# Patient Record
Sex: Female | Born: 1955 | Race: White | Hispanic: No | Marital: Married | Smoking: Current every day smoker
Health system: Southern US, Community
[De-identification: ages and names within clinical notes are randomized; demographics above are authoritative.]

## PROBLEM LIST (undated history)

## (undated) DIAGNOSIS — N189 Chronic kidney disease, unspecified: Secondary | ICD-10-CM

## (undated) DIAGNOSIS — B019 Varicella without complication: Secondary | ICD-10-CM

## (undated) DIAGNOSIS — IMO0002 Reserved for concepts with insufficient information to code with codable children: Secondary | ICD-10-CM

## (undated) DIAGNOSIS — R413 Other amnesia: Secondary | ICD-10-CM

## (undated) HISTORY — DX: Chronic kidney disease, unspecified: N18.9

## (undated) HISTORY — DX: Varicella without complication: B01.9

## (undated) HISTORY — PX: TONSILLECTOMY: SUR1361

## (undated) HISTORY — DX: Other amnesia: R41.3

## (undated) HISTORY — DX: Reserved for concepts with insufficient information to code with codable children: IMO0002

## (undated) HISTORY — PX: BREAST SURGERY: SHX581

## (undated) HISTORY — PX: FOOT SURGERY: SHX648

## (undated) HISTORY — PX: BREAST ENHANCEMENT SURGERY: SHX7

---

## 1998-02-02 ENCOUNTER — Emergency Department (HOSPITAL_COMMUNITY): Admission: RE | Admit: 1998-02-02 | Discharge: 1998-02-02 | Payer: Self-pay | Admitting: Emergency Medicine

## 1998-08-19 ENCOUNTER — Emergency Department (HOSPITAL_COMMUNITY): Admission: EM | Admit: 1998-08-19 | Discharge: 1998-08-19 | Payer: Self-pay | Admitting: Emergency Medicine

## 1998-08-19 ENCOUNTER — Encounter: Payer: Self-pay | Admitting: Emergency Medicine

## 1998-09-23 ENCOUNTER — Other Ambulatory Visit: Admission: RE | Admit: 1998-09-23 | Discharge: 1998-09-23 | Payer: Self-pay | Admitting: Obstetrics and Gynecology

## 2000-05-28 ENCOUNTER — Emergency Department (HOSPITAL_COMMUNITY): Admission: EM | Admit: 2000-05-28 | Discharge: 2000-05-29 | Payer: Self-pay | Admitting: Emergency Medicine

## 2000-06-28 ENCOUNTER — Ambulatory Visit (HOSPITAL_COMMUNITY): Admission: RE | Admit: 2000-06-28 | Discharge: 2000-06-28 | Payer: Self-pay | Admitting: Gastroenterology

## 2000-12-01 ENCOUNTER — Other Ambulatory Visit: Admission: RE | Admit: 2000-12-01 | Discharge: 2000-12-01 | Payer: Self-pay | Admitting: Obstetrics and Gynecology

## 2001-05-05 ENCOUNTER — Emergency Department (HOSPITAL_COMMUNITY): Admission: EM | Admit: 2001-05-05 | Discharge: 2001-05-06 | Payer: Self-pay | Admitting: Emergency Medicine

## 2001-05-07 ENCOUNTER — Emergency Department (HOSPITAL_COMMUNITY): Admission: EM | Admit: 2001-05-07 | Discharge: 2001-05-08 | Payer: Self-pay | Admitting: Emergency Medicine

## 2001-05-07 ENCOUNTER — Encounter: Payer: Self-pay | Admitting: Emergency Medicine

## 2001-05-22 ENCOUNTER — Encounter: Admission: RE | Admit: 2001-05-22 | Discharge: 2001-05-22 | Payer: Self-pay | Admitting: Gastroenterology

## 2001-05-22 ENCOUNTER — Encounter: Payer: Self-pay | Admitting: Gastroenterology

## 2007-11-20 LAB — HM COLONOSCOPY: HM Colonoscopy: NEGATIVE

## 2010-04-19 LAB — HM PAP SMEAR: HM Pap smear: NEGATIVE

## 2010-05-19 LAB — HM MAMMOGRAPHY: HM Mammogram: NEGATIVE

## 2011-07-21 ENCOUNTER — Ambulatory Visit (INDEPENDENT_AMBULATORY_CARE_PROVIDER_SITE_OTHER): Payer: BC Managed Care – PPO | Admitting: Family Medicine

## 2011-07-21 ENCOUNTER — Encounter: Payer: Self-pay | Admitting: Family Medicine

## 2011-07-21 DIAGNOSIS — Z780 Asymptomatic menopausal state: Secondary | ICD-10-CM

## 2011-07-21 DIAGNOSIS — Z720 Tobacco use: Secondary | ICD-10-CM | POA: Insufficient documentation

## 2011-07-21 DIAGNOSIS — F17200 Nicotine dependence, unspecified, uncomplicated: Secondary | ICD-10-CM

## 2011-07-21 MED ORDER — PROGESTERONE MICRONIZED 100 MG PO CAPS: 100.0000 mg | ORAL_CAPSULE | Freq: Every day | ORAL | Status: DC

## 2011-07-21 MED ORDER — ESTRADIOL 0.075 MG/24HR TD PTTW: 1.0000 | MEDICATED_PATCH | TRANSDERMAL | Status: DC

## 2011-07-21 NOTE — Progress Notes (Signed)
  Subjective:    Patient ID: Michaela Thompson, female    DOB: 08/19/1956, 55 y.o.   MRN: [id number]  HPI Patient is seen to establish care. Past medical history viewed. Question of remote peptic ulcer as a child. No significant chronic medical problems. She is postmenopausal and has been maintained on estrogen patches and progesterone per previous gynecologist. She had physical last summer. Old records pending. No other medications. Surgical history foot repair 1998. No details of that surgery.  Family history significant for mild osteoarthritis. Father died of coronary disease age 31. Paternal grandmother with CVA history.  Patient is married. 2 children and 6 grandchildren. Patient has worked previously with [employer]. Smokes one half pack per day 35 years. No alcohol use.  She desires to quit smoking. Plan is to try to quit on her own. Not interested in medications. Estrogen use as above. She's gotten yearly mammograms. No history of coagulopathy.   Review of Systems  Constitutional: Negative for fever, activity change, appetite change, fatigue and unexpected weight change.  HENT: Negative for hearing loss, ear pain, sore throat and trouble swallowing.   Eyes: Negative for visual disturbance.  Respiratory: Negative for cough and shortness of breath.   Cardiovascular: Negative for chest pain and palpitations.  Gastrointestinal: Negative for abdominal pain, diarrhea, constipation and blood in stool.  Genitourinary: Negative for dysuria and hematuria.  Musculoskeletal: Negative for myalgias, back pain and arthralgias.  Skin: Negative for rash.  Neurological: Negative for dizziness, syncope and headaches.  Hematological: Negative for adenopathy.  Psychiatric/Behavioral: Negative for confusion and dysphoric mood.       Objective:   Physical Exam  Constitutional: She is oriented to person, place, and time. She appears well-developed and well-nourished.  HENT:  Right Ear:  External ear normal.  Left Ear: External ear normal.  Mouth/Throat: Oropharynx is clear and moist.  Neck: Neck supple. No thyromegaly present.  Cardiovascular: Normal rate, regular rhythm and normal heart sounds.   No murmur heard. Pulmonary/Chest: Effort normal and breath sounds normal. No respiratory distress. She has no wheezes. She has no rales.  Musculoskeletal: She exhibits no edema.  Lymphadenopathy:    She has no cervical adenopathy.  Neurological: She is alert and oriented to person, place, and time. No cranial nerve deficit.  Skin: No rash noted.  Psychiatric: She has a normal mood and affect. Her behavior is normal. Judgment and thought content normal.          Assessment & Plan:  #1 postmenopausal. Long discussion with patient regarding pros and cons of estrogen replacement therapy. We have suggested that she consider tapering her in the next year. She is encouraged to schedule complete physical next bring and discuss further at that time. Refills given for now. #2 nicotine abuse. Spent approximately 10 minutes discussing smoking cessation strategies. She plans to try to quit on her own

## 2011-07-21 NOTE — Patient Instructions (Signed)
Consider complete physical next year

## 2011-09-10 ENCOUNTER — Other Ambulatory Visit (INDEPENDENT_AMBULATORY_CARE_PROVIDER_SITE_OTHER): Payer: BC Managed Care – PPO

## 2011-09-10 DIAGNOSIS — Z0000 Encounter for general adult medical examination without abnormal findings: Secondary | ICD-10-CM

## 2011-09-10 LAB — CBC WITH DIFFERENTIAL/PLATELET
Basophils Relative: 0.7 % (ref 0.0–3.0)
Eosinophils Absolute: 0.1 10*3/uL (ref 0.0–0.7)
Hemoglobin: 14 g/dL (ref 12.0–15.0)
MCHC: 33.9 g/dL (ref 30.0–36.0)
MCV: 93.5 fl (ref 78.0–100.0)
Monocytes Absolute: 0.5 10*3/uL (ref 0.1–1.0)
Neutro Abs: 2.3 10*3/uL (ref 1.4–7.7)
RBC: 4.42 Mil/uL (ref 3.87–5.11)

## 2011-09-10 LAB — HEPATIC FUNCTION PANEL
ALT: 13 U/L (ref 0–35)
Albumin: 3.7 g/dL (ref 3.5–5.2)
Alkaline Phosphatase: 38 U/L — ABNORMAL LOW (ref 39–117)
Total Protein: 6.4 g/dL (ref 6.0–8.3)

## 2011-09-10 LAB — BASIC METABOLIC PANEL
CO2: 29 mEq/L (ref 19–32)
Chloride: 106 mEq/L (ref 96–112)
Potassium: 3.9 mEq/L (ref 3.5–5.1)
Sodium: 141 mEq/L (ref 135–145)

## 2011-09-10 LAB — POCT URINALYSIS DIPSTICK
Bilirubin, UA: NEGATIVE
Glucose, UA: NEGATIVE
Ketones, UA: NEGATIVE
Leukocytes, UA: NEGATIVE
pH, UA: 8.5

## 2011-09-10 LAB — LDL CHOLESTEROL, DIRECT: Direct LDL: 166 mg/dL

## 2011-09-10 LAB — LIPID PANEL
Cholesterol: 246 mg/dL — ABNORMAL HIGH (ref 0–200)
Triglycerides: 55 mg/dL (ref 0.0–149.0)

## 2011-09-20 ENCOUNTER — Encounter: Payer: Self-pay | Admitting: Family Medicine

## 2011-09-20 ENCOUNTER — Ambulatory Visit (INDEPENDENT_AMBULATORY_CARE_PROVIDER_SITE_OTHER): Payer: BC Managed Care – PPO | Admitting: Family Medicine

## 2011-09-20 VITALS — BP 92/68 | Temp 98.0°F | Ht 62.0 in | Wt 123.5 lb

## 2011-09-20 DIAGNOSIS — Z0000 Encounter for general adult medical examination without abnormal findings: Secondary | ICD-10-CM

## 2011-09-20 NOTE — Patient Instructions (Signed)
Increase calcium consumption with a goal of about 1200 mg per day Try to quit smoking and let me know if I can assist you in any way Schedule repeat mammogram Continue regular weightbearing exercise. Consider repeat Pap smear by next year

## 2011-09-20 NOTE — Progress Notes (Signed)
  Subjective:    Patient ID: Michaela Thompson, female    DOB: 1955/12/18, 55 y.o.   MRN: [id number]  HPI  Patient seen for complete physical. Refer to prior note. She is postmenopausal. Ongoing nicotine use but has strong motivation to try to quit. She exercises fairly regularly. Has some osteoarthritis issues involving the knees. Takes vitamin D but no calcium. Last mammogram about a year ago. She has had normal Pap smears for several years and plans to go to every third year. No history of any reported abnormals.  Past Medical History  Diagnosis Date  . Chicken pox   . Ulcer     stomach as a child  . Chronic kidney disease    Past Surgical History  Procedure Date  . Foot surgery     reports that she has been smoking Cigarettes.  She has a 17.5 pack-year smoking history. She does not have any smokeless tobacco history on file. Her alcohol and drug histories not on file. family history includes Alcohol abuse in her maternal grandfather; Arthritis in her mother; Cancer in her maternal grandmother; Heart disease in her father; and Stroke in her maternal grandmother. Allergies  Allergen Reactions  . Sulfa Antibiotics     hives      Review of Systems  Constitutional: Positive for appetite change. Negative for fever, activity change and fatigue.  HENT: Negative for hearing loss, ear pain, sore throat and trouble swallowing.   Eyes: Negative for visual disturbance.  Respiratory: Negative for cough and shortness of breath.   Cardiovascular: Negative for chest pain and palpitations.  Gastrointestinal: Negative for abdominal pain, diarrhea, constipation and blood in stool.  Genitourinary: Negative for dysuria and hematuria.  Musculoskeletal: Positive for arthralgias. Negative for myalgias and back pain.  Skin: Negative for rash.  Neurological: Negative for dizziness, syncope and headaches.  Hematological: Negative for adenopathy.  Psychiatric/Behavioral: Negative for confusion and  dysphoric mood.       Objective:   Physical Exam  Constitutional: She is oriented to person, place, and time. She appears well-developed and well-nourished.  HENT:  Head: Normocephalic and atraumatic.  Eyes: EOM are normal. Pupils are equal, round, and reactive to light.  Neck: Normal range of motion. Neck supple. No thyromegaly present.  Cardiovascular: Normal rate, regular rhythm and normal heart sounds.   No murmur heard. Pulmonary/Chest: Breath sounds normal. No respiratory distress. She has no wheezes. She has no rales.  Abdominal: Soft. Bowel sounds are normal. She exhibits no distension and no mass. There is no tenderness. There is no rebound and no guarding.  Musculoskeletal: Normal range of motion. She exhibits no edema.  Lymphadenopathy:    She has no cervical adenopathy.  Neurological: She is alert and oriented to person, place, and time. She displays normal reflexes. No cranial nerve deficit.  Skin: No rash noted.  Psychiatric: She has a normal mood and affect. Her behavior is normal. Judgment and thought content normal.          Assessment & Plan:  Complete physical. Discussed smoking cessation. Schedule mammogram. Go to every other year or third year Pap smear. Increase calcium consumption. Colonoscopy up to date. Patient declines flu vaccine.

## 2012-01-25 ENCOUNTER — Ambulatory Visit (INDEPENDENT_AMBULATORY_CARE_PROVIDER_SITE_OTHER): Payer: BC Managed Care – PPO | Admitting: Family

## 2012-01-25 ENCOUNTER — Encounter: Payer: Self-pay | Admitting: Family

## 2012-01-25 VITALS — BP 116/72 | Temp 98.3°F | Wt 122.0 lb

## 2012-01-25 DIAGNOSIS — R51 Headache: Secondary | ICD-10-CM

## 2012-01-25 DIAGNOSIS — K5900 Constipation, unspecified: Secondary | ICD-10-CM

## 2012-01-25 LAB — POCT URINALYSIS DIPSTICK
Glucose, UA: NEGATIVE
Leukocytes, UA: NEGATIVE
Nitrite, UA: NEGATIVE
Protein, UA: NEGATIVE
Spec Grav, UA: 1.025
Urobilinogen, UA: 0.2

## 2012-01-25 NOTE — Patient Instructions (Signed)
Dehydration, Adult Dehydration is when you lose more fluids from the body than you take in. Vital organs like the kidneys, brain, and heart cannot function without a proper amount of fluids and salt. Any loss of fluids from the body can cause dehydration.  CAUSES   Vomiting.   Diarrhea.   Excessive sweating.   Excessive urine output.   Fever.  SYMPTOMS  Mild dehydration  Thirst.   Dry lips.   Slightly dry mouth.  Moderate dehydration  Very dry mouth.   Sunken eyes.   Skin does not bounce back quickly when lightly pinched and released.   Dark urine and decreased urine production.   Decreased tear production.   Headache.  Severe dehydration  Very dry mouth.   Extreme thirst.   Rapid, weak pulse (more than 100 beats per minute at rest).   Cold hands and feet.   Not able to sweat in spite of heat and temperature.   Rapid breathing.   Blue lips.   Confusion and lethargy.   Difficulty being awakened.   Minimal urine production.   No tears.  DIAGNOSIS  Your caregiver will diagnose dehydration based on your symptoms and your exam. Blood and urine tests will help confirm the diagnosis. The diagnostic evaluation should also identify the cause of dehydration. TREATMENT  Treatment of mild or moderate dehydration can often be done at home by increasing the amount of fluids that you drink. It is best to drink small amounts of fluid more often. Drinking too much at one time can make vomiting worse. Refer to the home care instructions below. Severe dehydration needs to be treated at the hospital where you will probably be given intravenous (IV) fluids that contain water and electrolytes. HOME CARE INSTRUCTIONS   Ask your caregiver about specific rehydration instructions.   Drink enough fluids to keep your urine clear or pale yellow.   Drink small amounts frequently if you have nausea and vomiting.   Eat as you normally do.   Avoid:   Foods or drinks high in  sugar.   Carbonated drinks.   Juice.   Extremely hot or cold fluids.   Drinks with caffeine.   Fatty, greasy foods.   Alcohol.   Tobacco.   Overeating.   Gelatin desserts.   Wash your hands well to avoid spreading bacteria and viruses.   Only take over-the-counter or prescription medicines for pain, discomfort, or fever as directed by your caregiver.   Ask your caregiver if you should continue all prescribed and over-the-counter medicines.   Keep all follow-up appointments with your caregiver.  SEEK MEDICAL CARE IF:  You have abdominal pain and it increases or stays in one area (localizes).   You have a rash, stiff neck, or severe headache.   You are irritable, sleepy, or difficult to awaken.   You are weak, dizzy, or extremely thirsty.  SEEK IMMEDIATE MEDICAL CARE IF:   You are unable to keep fluids down or you get worse despite treatment.   You have frequent episodes of vomiting or diarrhea.   You have blood or green matter (bile) in your vomit.   You have blood in your stool or your stool looks black and tarry.   You have not urinated in 6 to 8 hours, or you have only urinated a small amount of very dark urine.   You have a fever.   You faint.  MAKE SURE YOU:   Understand these instructions.   Will watch your condition.     Will get help right away if you are not doing well or get worse.  Document Released: 10/11/2005 Document Revised: 09/30/2011 Document Reviewed: 05/31/2011 [employer] Patient Information 2012 [employer]. 

## 2012-01-25 NOTE — Progress Notes (Signed)
Subjective:    Patient ID: Michaela Thompson, female    DOB: 01-19-1956, 56 y.o.   MRN: [id number]  HPI 56 year old white female, smoker, patient of Dr. Caryl Never is in today with complaints of constipation, dry mouth, dry skin and headache that's been going on for 2 days. She has not taken anything for her symptoms. She has been exercising on a regular basis. Denies an increase in her fluid intake. Denies any sneezing, coughing, congestion, itchy or watery. No new medications.    Review of Systems  Constitutional: Negative.   Eyes: Negative.   Respiratory: Negative.   Cardiovascular: Negative.   Gastrointestinal: Positive for constipation.  Genitourinary: Negative.   Musculoskeletal: Negative.   Neurological: Positive for headaches.  Hematological: Negative.   Psychiatric/Behavioral: Negative.    Past Medical History  Diagnosis Date  . Chicken pox   . Ulcer     stomach as a child  . Chronic kidney disease     History   Social History  . Marital Status: Married    Spouse Name: N/A    Number of Children: N/A  . Years of Education: N/A   Occupational History  . Not on file.   Social History Main Topics  . Smoking status: Current Everyday Smoker -- 0.5 packs/day for 35 years    Types: Cigarettes  . Smokeless tobacco: Not on file  . Alcohol Use: Not on file  . Drug Use: Not on file  . Sexually Active: Not on file   Other Topics Concern  . Not on file   Social History Narrative  . No narrative on file    Past Surgical History  Procedure Date  . Foot surgery     Family History  Problem Relation Age of Onset  . Arthritis Mother   . Heart disease Father     sudden death > 50  . Cancer Maternal Grandmother     breast  . Stroke Maternal Grandmother   . Alcohol abuse Maternal Grandfather     Allergies  Allergen Reactions  . Sulfa Antibiotics     hives    Current Outpatient Prescriptions on File Prior to Visit  Medication Sig Dispense Refill  .  estradiol (VIVELLE-DOT) 0.075 MG/24HR Place 1 patch onto the skin 2 (two) times a week.  8 patch  6  . progesterone (PROMETRIUM) 100 MG capsule Take 1 capsule (100 mg total) by mouth daily.  30 capsule  6    BP 116/72  Temp(Src) 98.3 F (36.8 C) (Oral)  Wt 122 lb (55.339 kg)chart    Objective:   Physical Exam  Constitutional: She is oriented to person, place, and time. She appears well-developed and well-nourished.  HENT:  Right Ear: External ear normal.  Left Ear: External ear normal.  Nose: Nose normal.  Mouth/Throat: Oropharynx is clear and moist.       Lips are dry and cracked  Cardiovascular: Normal rate and normal heart sounds.   Pulmonary/Chest: Effort normal and breath sounds normal.  Abdominal: Soft.  Musculoskeletal: Normal range of motion.  Neurological: She is alert and oriented to person, place, and time.  Skin: Skin is warm and dry.  Psychiatric: She has a normal mood and affect.          Assessment & Plan:  Assessment: Constipation, headache, dry skin, likely dehydration  Plan: Increase fluid intake. Labs to include BMP and CBC will notify patient of results. Call the office if symptoms worsen or persist, return as scheduled or  when necessary.

## 2012-01-26 LAB — CBC WITH DIFFERENTIAL/PLATELET
Basophils Absolute: 0 10*3/uL (ref 0.0–0.1)
Eosinophils Relative: 1.6 % (ref 0.0–5.0)
HCT: 42.2 % (ref 36.0–46.0)
Hemoglobin: 14.3 g/dL (ref 12.0–15.0)
Lymphocytes Relative: 24.6 % (ref 12.0–46.0)
Lymphs Abs: 0.9 10*3/uL (ref 0.7–4.0)
Monocytes Relative: 11.2 % (ref 3.0–12.0)
Neutro Abs: 2.3 10*3/uL (ref 1.4–7.7)
RBC: 4.59 Mil/uL (ref 3.87–5.11)
WBC: 3.7 10*3/uL — ABNORMAL LOW (ref 4.5–10.5)

## 2012-01-26 LAB — BASIC METABOLIC PANEL
Calcium: 9.1 mg/dL (ref 8.4–10.5)
GFR: 77.69 mL/min (ref >60.00)
Potassium: 4.7 mEq/L (ref 3.5–5.1)
Sodium: 140 mEq/L (ref 135–145)

## 2012-02-29 ENCOUNTER — Encounter: Payer: Self-pay | Admitting: Family Medicine

## 2012-02-29 ENCOUNTER — Ambulatory Visit (INDEPENDENT_AMBULATORY_CARE_PROVIDER_SITE_OTHER): Payer: BC Managed Care – PPO | Admitting: Family Medicine

## 2012-02-29 VITALS — BP 116/70 | HR 91 | Temp 97.9°F | Wt 118.0 lb

## 2012-02-29 DIAGNOSIS — R1013 Epigastric pain: Secondary | ICD-10-CM

## 2012-02-29 LAB — POCT URINALYSIS DIPSTICK
Blood, UA: NEGATIVE
Nitrite, UA: NEGATIVE
Urobilinogen, UA: 0.2
pH, UA: 7

## 2012-02-29 MED ORDER — HYDROCODONE-ACETAMINOPHEN 5-500 MG PO TABS: 1.0000 | ORAL_TABLET | ORAL | Status: AC | PRN

## 2012-02-29 MED ORDER — PROMETHAZINE HCL 25 MG PO TABS: 25.0000 mg | ORAL_TABLET | ORAL | Status: DC | PRN

## 2012-02-29 NOTE — Progress Notes (Signed)
  Subjective:    Patient ID: Michaela Thompson, female    DOB: September 24, 1956, 56 y.o.   MRN: [id number]  HPI Here for the sudden onset of epigastric pain that started suddenly last night and has continued all day today. This is worst in the central upper abdomen but involves the entire upper abdomen. It is constant but does wax and wane. No pain in the back. She is nauseated but has not vomited. She has little appetite but has been sipping fluids. No fever at all. No urinary symptoms. Her last BM was this morning and was normal. She tried some OTC antacids with no results.    Review of Systems  Constitutional: Negative.   Respiratory: Negative.   Cardiovascular: Negative.   Gastrointestinal: Positive for nausea and abdominal pain. Negative for vomiting, diarrhea, constipation, blood in stool, abdominal distention and rectal pain.  Genitourinary: Negative.        Objective:   Physical Exam  Constitutional:       Appears very uncomfortable,alert  Cardiovascular: Normal rate, regular rhythm, normal heart sounds and intact distal pulses.   Pulmonary/Chest: Effort normal and breath sounds normal.  Abdominal: Soft. Bowel sounds are normal. She exhibits no distension and no mass. There is no rebound and no guarding.       Moderately tender in the epigastrium and RUQ           Assessment & Plan:  This is probably gall bladder disease. We will get labs today and will set up an abdominal US ASAP. She will continue to drink fluids. She knows to call 911 tonight if the pain would get worse or if she would get a fever.

## 2012-03-01 ENCOUNTER — Ambulatory Visit
Admission: RE | Admit: 2012-03-01 | Discharge: 2012-03-01 | Disposition: A | Discharge status: Home / Self Care | Payer: BC Managed Care – PPO | Source: Ambulatory Visit | Attending: Family Medicine | Admitting: Family Medicine

## 2012-03-01 DIAGNOSIS — R1013 Epigastric pain: Secondary | ICD-10-CM

## 2012-03-01 LAB — CBC WITH DIFFERENTIAL/PLATELET
Basophils Relative: 0.4 % (ref 0.0–3.0)
Eosinophils Relative: 0.4 % (ref 0.0–5.0)
HCT: 44.7 % (ref 36.0–46.0)
Lymphs Abs: 1.1 10*3/uL (ref 0.7–4.0)
MCV: 89.2 fl (ref 78.0–100.0)
Monocytes Absolute: 0.5 10*3/uL (ref 0.1–1.0)
Neutro Abs: 5 10*3/uL (ref 1.4–7.7)
Platelets: 209 10*3/uL (ref 150.0–400.0)
RBC: 5.02 Mil/uL (ref 3.87–5.11)
WBC: 6.7 10*3/uL (ref 4.5–10.5)

## 2012-03-01 LAB — BASIC METABOLIC PANEL
CO2: 27 mEq/L (ref 19–32)
Calcium: 10.1 mg/dL (ref 8.4–10.5)
Chloride: 102 mEq/L (ref 96–112)
Creatinine, Ser: 0.7 mg/dL (ref 0.4–1.2)
Glucose, Bld: 97 mg/dL (ref 70–99)

## 2012-03-01 LAB — LIPASE: Lipase: 26 U/L (ref 11.0–59.0)

## 2012-03-01 LAB — HEPATIC FUNCTION PANEL
ALT: 20 U/L (ref 0–35)
Albumin: 4.4 g/dL (ref 3.5–5.2)
Total Protein: 7.3 g/dL (ref 6.0–8.3)

## 2012-03-01 LAB — AMYLASE: Amylase: 65 U/L (ref 27–131)

## 2012-03-02 NOTE — Progress Notes (Signed)
Quick Note:  I left voice message with normal results. ______ 

## 2012-03-02 NOTE — Progress Notes (Signed)
Quick Note:  I left voice message with normal results and advised pt to follow up with PCP. ______

## 2012-03-04 ENCOUNTER — Emergency Department (HOSPITAL_COMMUNITY): Payer: BC Managed Care – PPO

## 2012-03-04 ENCOUNTER — Encounter (HOSPITAL_COMMUNITY): Payer: Self-pay

## 2012-03-04 ENCOUNTER — Emergency Department (HOSPITAL_COMMUNITY)
Admission: EM | Admit: 2012-03-04 | Discharge: 2012-03-04 | Disposition: A | Discharge status: Home / Self Care | Payer: BC Managed Care – PPO | Attending: Emergency Medicine | Admitting: Emergency Medicine

## 2012-03-04 DIAGNOSIS — R0789 Other chest pain: Secondary | ICD-10-CM

## 2012-03-04 DIAGNOSIS — N189 Chronic kidney disease, unspecified: Secondary | ICD-10-CM | POA: Insufficient documentation

## 2012-03-04 DIAGNOSIS — F17200 Nicotine dependence, unspecified, uncomplicated: Secondary | ICD-10-CM | POA: Insufficient documentation

## 2012-03-04 DIAGNOSIS — R109 Unspecified abdominal pain: Secondary | ICD-10-CM | POA: Insufficient documentation

## 2012-03-04 DIAGNOSIS — Z79899 Other long term (current) drug therapy: Secondary | ICD-10-CM | POA: Insufficient documentation

## 2012-03-04 DIAGNOSIS — R079 Chest pain, unspecified: Secondary | ICD-10-CM | POA: Insufficient documentation

## 2012-03-04 LAB — CBC
Hemoglobin: 14.2 g/dL (ref 12.0–15.0)
MCH: 29.9 pg (ref 26.0–34.0)
Platelets: 188 10*3/uL (ref 150–400)
RBC: 4.75 MIL/uL (ref 3.87–5.11)
WBC: 5.1 10*3/uL (ref 4.0–10.5)

## 2012-03-04 LAB — COMPREHENSIVE METABOLIC PANEL
ALT: 12 U/L (ref 0–35)
Alkaline Phosphatase: 50 U/L (ref 39–117)
BUN: 16 mg/dL (ref 6–23)
CO2: 24 mEq/L (ref 19–32)
Chloride: 105 mEq/L (ref 96–112)
GFR calc Af Amer: >90 mL/min (ref >90)
Glucose, Bld: 82 mg/dL (ref 70–99)
Potassium: 3.8 mEq/L (ref 3.5–5.1)
Sodium: 141 mEq/L (ref 135–145)
Total Bilirubin: 0.3 mg/dL (ref 0.3–1.2)
Total Protein: 6.3 g/dL (ref 6.0–8.3)

## 2012-03-04 LAB — DIFFERENTIAL
Eosinophils Absolute: 0 10*3/uL (ref 0.0–0.7)
Lymphocytes Relative: 22 % (ref 12–46)
Lymphs Abs: 1.1 10*3/uL (ref 0.7–4.0)
Monocytes Relative: 7 % (ref 3–12)
Neutro Abs: 3.5 10*3/uL (ref 1.7–7.7)
Neutrophils Relative %: 70 % (ref 43–77)

## 2012-03-04 LAB — TROPONIN I: Troponin I: <0.3 ng/mL (ref <0.30)

## 2012-03-04 MED ORDER — MAGNESIUM CITRATE PO SOLN: 296.0000 mL | Freq: Once | ORAL | Status: AC

## 2012-03-04 NOTE — ED Notes (Signed)
Pt states she has had increased levels of stress and was constipated.  Pt states she had abdominal pain and went to use the restroom and was in the middle of having a BM when symptoms began.  Pt states while she was in the process of having the BM she began feeling shaky, lightheaded, diaphoretic, and began having CP.  Pt currently denies any symptoms.  Pt was able to complete the BM.  Pt states she has been very constipated and was having a difficult time passing this stool.

## 2012-03-04 NOTE — Discharge Instructions (Signed)
Chest Pain (Nonspecific) It is often hard to give a specific diagnosis for the cause of chest pain. There is always a chance that your pain could be related to something serious, such as a heart attack or a blood clot in the lungs. You need to follow up with your caregiver for further evaluation. CAUSES   Heartburn.   Pneumonia or bronchitis.   Anxiety or stress.   Inflammation around your heart (pericarditis) or lung (pleuritis or pleurisy).   A blood clot in the lung.   A collapsed lung (pneumothorax). It can develop suddenly on its own (spontaneous pneumothorax) or from injury (trauma) to the chest.   Shingles infection (herpes zoster virus).  The chest wall is composed of bones, muscles, and cartilage. Any of these can be the source of the pain.  The bones can be bruised by injury.   The muscles or cartilage can be strained by coughing or overwork.   The cartilage can be affected by inflammation and become sore (costochondritis).  DIAGNOSIS  Lab tests or other studies, such as X-rays, electrocardiography, stress testing, or cardiac imaging, may be needed to find the cause of your pain.  TREATMENT   Treatment depends on what may be causing your chest pain. Treatment may include:   Acid blockers for heartburn.   Anti-inflammatory medicine.   Pain medicine for inflammatory conditions.   Antibiotics if an infection is present.   You may be advised to change lifestyle habits. This includes stopping smoking and avoiding alcohol, caffeine, and chocolate.   You may be advised to keep your head raised (elevated) when sleeping. This reduces the chance of acid going backward from your stomach into your esophagus.   Most of the time, nonspecific chest pain will improve within 2 to 3 days with rest and mild pain medicine.  HOME CARE INSTRUCTIONS   If antibiotics were prescribed, take your antibiotics as directed. Finish them even if you start to feel better.   For the next few  days, avoid physical activities that bring on chest pain. Continue physical activities as directed.   Do not smoke.   Avoid drinking alcohol.   Only take over-the-counter or prescription medicine for pain, discomfort, or fever as directed by your caregiver.   Follow your caregiver's suggestions for further testing if your chest pain does not go away.   Keep any follow-up appointments you made. If you do not go to an appointment, you could develop lasting (chronic) problems with pain. If there is any problem keeping an appointment, you must call to reschedule.  SEEK MEDICAL CARE IF:   You think you are having problems from the medicine you are taking. Read your medicine instructions carefully.   Your chest pain does not go away, even after treatment.   You develop a rash with blisters on your chest.  SEEK IMMEDIATE MEDICAL CARE IF:   You have increased chest pain or pain that spreads to your arm, neck, jaw, back, or abdomen.   You develop shortness of breath, an increasing cough, or you are coughing up blood.   You have severe back or abdominal pain, feel nauseous, or vomit.   You develop severe weakness, fainting, or chills.   You have a fever.  THIS IS AN EMERGENCY. Do not wait to see if the pain will go away. Get medical help at once. Call your local emergency services (911 in [place] Do not drive yourself to the hospital. MAKE SURE YOU:   Understand these instructions.     Will watch your condition.   Will get help right away if you are not doing well or get worse.  Document Released: 07/21/2005 Document Revised: 09/30/2011 Document Reviewed: 05/16/2008 [employer] Patient Information 2012 [employer]. 

## 2012-03-04 NOTE — ED Notes (Signed)
MD at bedside. Dr. Delo. 

## 2012-03-04 NOTE — ED Notes (Signed)
Pt also reports hx of constipation, pt reports LBM on Monday

## 2012-03-04 NOTE — ED Notes (Signed)
Pt brought in by [facility] and moved over to bed. Pt placed on monitor and continuous blood pressure, pulse ox and EKG

## 2012-03-04 NOTE — ED Notes (Signed)
Pt reports hearing "abd sounds" in her mid abd region radiating up to epigastric area, chest "discomfort," nausea, and diaphoresis starting approx 0900 this am while straining to have a BM. Pt reports chest "discomfort" has subsided, but the "abd sounds" continue.

## 2012-03-04 NOTE — ED Provider Notes (Signed)
History     CSN: [id number]  Arrival date & time 03/04/12  1308   First MD Initiated Contact with Patient 03/04/12 1005      Chief Complaint  Patient presents with  . Chest Pain  . Abdominal Pain    (Consider location/radiation/quality/duration/timing/severity/associated sxs/prior treatment) HPI Comments: Patient reports constipation, abd cramping for the past couple of weeks.  She was in the bathroom today trying to have a bowel movement.  She was straining, then developed dizziness, light-headedness, and discomfort in her chest.  She felt like she was going to pass out and husband called 911.  She feels fine now.  Never happened before.  No prior cardiac history.  Had labs, ultrasound last week for abd pain, constipation.  Patient is a 56 y.o. female presenting with chest pain and abdominal pain. The history is provided by the patient.  Chest Pain The chest pain began 1 - 2 hours ago. Chest pain occurs constantly. The chest pain is resolved. Associated with: bowel movement. The severity of the pain is moderate. The pain does not radiate. Primary symptoms include abdominal pain. She tried nothing for the symptoms. Risk factors: smoker.    Abdominal Pain The primary symptoms of the illness include abdominal pain.    Past Medical History  Diagnosis Date  . Chicken pox   . Ulcer     stomach as a child  . Chronic kidney disease     Past Surgical History  Procedure Date  . Foot surgery   . Tonsillectomy   . Breast surgery     Family History  Problem Relation Age of Onset  . Arthritis Mother   . Heart disease Father     sudden death > 50  . Cancer Maternal Grandmother     breast  . Stroke Maternal Grandmother   . Alcohol abuse Maternal Grandfather     History  Substance Use Topics  . Smoking status: Current Everyday Smoker -- 0.5 packs/day for 35 years    Types: Cigarettes  . Smokeless tobacco: Never Used  . Alcohol Use: No    OB History    Grav Para Term  Preterm Abortions TAB SAB Ect Mult Living                  Review of Systems  Cardiovascular: Positive for chest pain.  Gastrointestinal: Positive for abdominal pain.  All other systems reviewed and are negative.    Allergies  Sulfa antibiotics  Home Medications   Current Outpatient Rx  Name Route Sig Dispense Refill  . VITAMIN D2 2000 UNITS PO TABS Oral Take 1 tablet by mouth every other day.    Marland Kitchen HYDROCODONE-ACETAMINOPHEN 5-500 MG PO TABS Oral Take 1 tablet by mouth every 4 (four) hours as needed for pain. 30 tablet 0  . MAGNESIUM PO Oral Take 1 tablet by mouth daily.    Marland Kitchen ZINC PO Oral Take 1 tablet by mouth daily as needed. For cold symptoms      BP 115/81  Pulse 68  Resp 11  SpO2 100%  Physical Exam  Nursing note and vitals reviewed. Constitutional: She is oriented to person, place, and time. She appears well-developed and well-nourished. No distress.  HENT:  Head: Normocephalic and atraumatic.  Neck: Normal range of motion. Neck supple.  Cardiovascular: Normal rate and regular rhythm.  Exam reveals no gallop and no friction rub.   No murmur heard. Pulmonary/Chest: Effort normal and breath sounds normal. No respiratory distress. She has no wheezes.  Abdominal: Soft. Bowel sounds are normal. She exhibits no distension. There is no tenderness.  Musculoskeletal: Normal range of motion.  Neurological: She is alert and oriented to person, place, and time.  Skin: Skin is warm and dry. She is not diaphoretic.    ED Course  Procedures (including critical care time)   Labs Reviewed  CBC  DIFFERENTIAL  COMPREHENSIVE METABOLIC PANEL  TROPONIN I   No results found.   No diagnosis found.   Date: 03/04/2012  Rate: 64  Rhythm: normal sinus rhythm  QRS Axis: normal  Intervals: normal  ST/T Wave abnormalities: normal  Conduction Disutrbances:none  Narrative Interpretation:   Old EKG Reviewed: none available    MDM  The patient presents after sounds like  what I believe to be a vasovagal episode after straining to have a bowel movement.  The ekg and trop are okay and she feels fine now.  I will discharge her to home, to return prn.        Geoffery Lyons, MD 03/04/12 1246

## 2012-03-04 NOTE — ED Notes (Signed)
Pt placed back on monitor, EKG, continuous pulse ox and blood pressure cuff

## 2012-07-17 ENCOUNTER — Ambulatory Visit (INDEPENDENT_AMBULATORY_CARE_PROVIDER_SITE_OTHER): Payer: BC Managed Care – PPO | Admitting: Family Medicine

## 2012-07-17 ENCOUNTER — Encounter: Payer: Self-pay | Admitting: Family Medicine

## 2012-07-17 ENCOUNTER — Telehealth: Payer: Self-pay | Admitting: Family Medicine

## 2012-07-17 VITALS — BP 110/70 | Temp 98.1°F | Wt 119.0 lb

## 2012-07-17 DIAGNOSIS — I889 Nonspecific lymphadenitis, unspecified: Secondary | ICD-10-CM

## 2012-07-17 NOTE — Progress Notes (Signed)
  Subjective:    Patient ID: Michaela Thompson, female    DOB: 06/06/1956, 56 y.o.   MRN: [id number]  HPI  Tender area left axillary region. Just noticed today. No skin changes. No fever or chills. No other adenopathy noted. Last mammogram reportedly 2 years ago and normal. History of bilateral breast implants. She has not noted any breast masses. No nipple discharge. No recent appetite or weight changes. Still smoking but is trying to quit.   Review of Systems  Constitutional: Negative for fever, chills, appetite change and unexpected weight change.  Respiratory: Negative for cough and shortness of breath.   Cardiovascular: Negative for chest pain.  Hematological: Does not bruise/bleed easily.       Objective:   Physical Exam  Constitutional: She appears well-developed and well-nourished.  Neck: Neck supple.       No neck or supraclavicular adenopathy  Cardiovascular: Normal rate and regular rhythm.   Pulmonary/Chest: Effort normal and breath sounds normal. No respiratory distress. She has no wheezes. She has no rales.       Patient has approximate half centimeter mobile slightly tender left axillary node. No overlying skin changes. No other adenopathy noted in the axillary region  Genitourinary:       Breast exam reveals no nipple inversion. No nipple discharge. She has bilateral implants. No breast masses. No skin dimpling.  Musculoskeletal: She exhibits no edema.  Lymphadenopathy:    She has no cervical adenopathy.          Assessment & Plan:  Solitary small lymph node left axillary region. Suspect reactive. No evidence for abscess. No evidence for cyst. Reassurance. She is overdue for screening mammography and this is recommended.

## 2012-07-17 NOTE — Patient Instructions (Addendum)
Followup promptly for any progressive lymphadenopathy Schedule repeat screening mammogram

## 2012-07-17 NOTE — Telephone Encounter (Signed)
Caller: [name]/Patient; Patient Name: Michaela Thompson; PCP: Evelena Peat [facility]); Best Callback Phone Number: [phone]; Calling regarding she has a lump under her left arm, axillary area, onset 07/17/12 and about 3/4 inch, tender to touch. No redness reported. Emergent signs and symptoms ruled out as per Skin Lesions protocol except for see in 24 hours due to painful lesion, appointment scheduled for 07/17/12 at 3:30 Dr.Burchette.

## 2012-08-28 ENCOUNTER — Encounter: Payer: Self-pay | Admitting: Family Medicine

## 2012-08-28 NOTE — Telephone Encounter (Signed)
I will write pt a message in My Chart to call for OV soon for evaluation.

## 2012-08-28 NOTE — Telephone Encounter (Signed)
Attempted to call only phone number on pt chart, "the number is no longer in service."

## 2012-10-26 ENCOUNTER — Encounter: Payer: Self-pay | Admitting: Family Medicine

## 2012-10-26 ENCOUNTER — Ambulatory Visit (INDEPENDENT_AMBULATORY_CARE_PROVIDER_SITE_OTHER): Payer: BC Managed Care – PPO | Admitting: Family Medicine

## 2012-10-26 VITALS — BP 112/82 | Temp 98.1°F | Wt 122.0 lb

## 2012-10-26 DIAGNOSIS — L259 Unspecified contact dermatitis, unspecified cause: Secondary | ICD-10-CM

## 2012-10-26 MED ORDER — PREDNISONE 10 MG PO TABS: ORAL_TABLET | ORAL | Status: DC

## 2012-10-26 NOTE — Patient Instructions (Addendum)

## 2012-10-26 NOTE — Progress Notes (Signed)
  Subjective:    Patient ID: Michaela Thompson, female    DOB: 1956-09-01, 57 y.o.   MRN: [id number]  HPI Pruritic rash involving both upper extremities. Started last Saturday after doing some yard work. Significantly pruritic. No fevers or chills. She's tried topical over-the-counter cream without much success.   Review of Systems  Constitutional: Negative for fever and chills.  Skin: Positive for rash.       Objective:   Physical Exam  Constitutional: She appears well-developed and well-nourished.  Cardiovascular: Normal rate and regular rhythm.   Pulmonary/Chest: Effort normal and breath sounds normal. No respiratory distress. She has no wheezes. She has no rales.  Skin: Rash noted.       Patient has vesicular rash in patches with erythematous base especially involving left upper extremity. 1 in linear distribution. She has several excoriations. No cellulitis changes at this time.          Assessment & Plan:  Contact dermatitis. Recommend prednisone taper. Keep areas clean. Followup promptly for signs of secondary infection

## 2012-12-09 ENCOUNTER — Other Ambulatory Visit: Payer: Self-pay

## 2012-12-11 ENCOUNTER — Telehealth: Payer: Self-pay | Admitting: Family Medicine

## 2012-12-11 ENCOUNTER — Other Ambulatory Visit (INDEPENDENT_AMBULATORY_CARE_PROVIDER_SITE_OTHER): Payer: BC Managed Care – PPO

## 2012-12-11 DIAGNOSIS — Z0000 Encounter for general adult medical examination without abnormal findings: Secondary | ICD-10-CM

## 2012-12-11 LAB — POCT URINALYSIS DIPSTICK
Bilirubin, UA: NEGATIVE
Ketones, UA: NEGATIVE
Spec Grav, UA: 1.02
pH, UA: 7

## 2012-12-11 LAB — CBC WITH DIFFERENTIAL/PLATELET
Basophils Absolute: 0 10*3/uL (ref 0.0–0.1)
Eosinophils Absolute: 0.1 10*3/uL (ref 0.0–0.7)
Hemoglobin: 13.8 g/dL (ref 12.0–15.0)
Lymphocytes Relative: 35.4 % (ref 12.0–46.0)
Lymphs Abs: 1.6 10*3/uL (ref 0.7–4.0)
MCHC: 33.6 g/dL (ref 30.0–36.0)
Neutro Abs: 2.4 10*3/uL (ref 1.4–7.7)
Platelets: 183 10*3/uL (ref 150.0–400.0)
RDW: 13.4 % (ref 11.5–14.6)

## 2012-12-11 LAB — LIPID PANEL
Cholesterol: 233 mg/dL — ABNORMAL HIGH (ref 0–200)
HDL: 71.8 mg/dL (ref >39.00)
Triglycerides: 65 mg/dL (ref 0.0–149.0)

## 2012-12-11 LAB — HEPATIC FUNCTION PANEL
ALT: 14 U/L (ref 0–35)
AST: 17 U/L (ref 0–37)
Alkaline Phosphatase: 44 U/L (ref 39–117)
Bilirubin, Direct: 0.1 mg/dL (ref 0.0–0.3)
Total Protein: 6 g/dL (ref 6.0–8.3)

## 2012-12-11 LAB — BASIC METABOLIC PANEL
Calcium: 9.2 mg/dL (ref 8.4–10.5)
Chloride: 105 mEq/L (ref 96–112)
Creatinine, Ser: 0.7 mg/dL (ref 0.4–1.2)
Sodium: 140 mEq/L (ref 135–145)

## 2012-12-11 NOTE — Telephone Encounter (Signed)
Michaela Thompson is document DPR for pt.  Pt had lab work done today for her CPX next week.  He had his children have noticed some strange behavior/actions and he is wondering if it could be hormone related or anxiety based.  He would like to come to appt, but cannot, so if Dr Caryl Never would like more information he can call me at [phone].  He wants to be sure she will get "a good A-Z physical" FYI

## 2012-12-11 NOTE — Telephone Encounter (Signed)
Per pt's husbands request, will you please call him in regards to Mrs. Foglesong's cpx on Monday.  He wants to make sure that Dr. Caryl Never or you knows his concerns for her cpx.  I advised him that everything would be taken care of and discussed at the time of her cpx, but he insisted on someone calling him. Please advise. Thank you.

## 2012-12-11 NOTE — Telephone Encounter (Signed)
Tried to return his call and left message.

## 2012-12-18 ENCOUNTER — Other Ambulatory Visit (HOSPITAL_COMMUNITY)
Admission: RE | Admit: 2012-12-18 | Discharge: 2012-12-18 | Disposition: A | Discharge status: Home / Self Care | Payer: BC Managed Care – PPO | Source: Ambulatory Visit | Attending: Family Medicine | Admitting: Family Medicine

## 2012-12-18 ENCOUNTER — Encounter: Payer: Self-pay | Admitting: Family Medicine

## 2012-12-18 ENCOUNTER — Ambulatory Visit (INDEPENDENT_AMBULATORY_CARE_PROVIDER_SITE_OTHER): Payer: BC Managed Care – PPO | Admitting: Family Medicine

## 2012-12-18 VITALS — BP 110/82 | HR 72 | Temp 98.0°F | Resp 12 | Ht 62.5 in | Wt 122.0 lb

## 2012-12-18 DIAGNOSIS — Z0000 Encounter for general adult medical examination without abnormal findings: Secondary | ICD-10-CM

## 2012-12-18 DIAGNOSIS — Z23 Encounter for immunization: Secondary | ICD-10-CM

## 2012-12-18 DIAGNOSIS — F09 Unspecified mental disorder due to known physiological condition: Secondary | ICD-10-CM

## 2012-12-18 DIAGNOSIS — Z01419 Encounter for gynecological examination (general) (routine) without abnormal findings: Secondary | ICD-10-CM | POA: Insufficient documentation

## 2012-12-18 NOTE — Addendum Note (Signed)
Addended by: Melchor Amour on: 12/18/2012 12:10 PM   Modules accepted: Orders

## 2012-12-18 NOTE — Patient Instructions (Addendum)
Set up repeat mammogram  

## 2012-12-18 NOTE — Progress Notes (Signed)
  Subjective:    Patient ID: Michaela Thompson, female    DOB: [birth year], 57 y.o.   MRN: [id number]  HPI Here for complete physical. Patient generally very healthy. Last Pap smear a least 2 years ago. We cannot confirm last tetanus. No mammogram in over a couple years. She still smokes about half-pack cigarettes per day.  Accompanied by husband. He has concerns that for past couple of years she's had some slow loss of cognitive function and memory loss. Occasional irritability but no delusions and no agitation. No history of depression. No headaches. No appetite or weight changes. No focal neurologic symptoms.  Past Medical History  Diagnosis Date  . Chicken pox   . Ulcer     stomach as a child  . Chronic kidney disease    Past Surgical History  Procedure Laterality Date  . Foot surgery    . Tonsillectomy    . Breast surgery      reports that she has been smoking Cigarettes.  She has a 17.5 pack-year smoking history. She has never used smokeless tobacco. She reports that she does not drink alcohol or use illicit drugs. family history includes Alcohol abuse in her maternal grandfather; Arthritis in her mother; Cancer in her maternal grandmother; Heart disease in her father; and Stroke in her maternal grandmother. Allergies  Allergen Reactions  . Sulfa Antibiotics     hives      Review of Systems  Constitutional: Negative for fever, activity change, appetite change, fatigue and unexpected weight change.  HENT: Negative for hearing loss, ear pain, sore throat and trouble swallowing.   Eyes: Negative for visual disturbance.  Respiratory: Negative for cough and shortness of breath.   Cardiovascular: Negative for chest pain and palpitations.  Gastrointestinal: Negative for abdominal pain, diarrhea, constipation and blood in stool.  Genitourinary: Negative for dysuria and hematuria.  Musculoskeletal: Negative for myalgias, back pain and arthralgias.  Skin: Negative for rash.   Neurological: Negative for dizziness, syncope and headaches.  Hematological: Negative for adenopathy.  Psychiatric/Behavioral: Negative for hallucinations, self-injury, dysphoric mood and agitation.       Objective:   Physical Exam  Constitutional: She is oriented to person, place, and time. She appears well-developed and well-nourished.  HENT:  Head: Normocephalic and atraumatic.  Eyes: EOM are normal. Pupils are equal, round, and reactive to light.  Neck: Normal range of motion. Neck supple. No thyromegaly present.  Cardiovascular: Normal rate, regular rhythm and normal heart sounds.   No murmur heard. Pulmonary/Chest: Breath sounds normal. No respiratory distress. She has no wheezes. She has no rales.  Abdominal: Soft. Bowel sounds are normal. She exhibits no distension and no mass. There is no tenderness. There is no rebound and no guarding.  Genitourinary: Vagina normal and uterus normal. No vaginal discharge found.  Cervix appears normal. Pap smear obtained. Bimanual normal size uterus. No tenderness. No masses palpated  Musculoskeletal: Normal range of motion. She exhibits no edema.  Lymphadenopathy:    She has no cervical adenopathy.  Neurological: She is alert and oriented to person, place, and time. She displays normal reflexes. No cranial nerve deficit.  Skin: No rash noted.  Psychiatric:  Somewhat flat affect          Assessment & Plan:  Health maintenance. Pap smear obtained. Set up mammogram. Tetanus booster given.  Concern for gradual cognitive decline. Obtain mini mental status exam Patient scored 29/30. We've recommended a followup in 6 months and reassess them

## 2013-03-12 ENCOUNTER — Encounter: Payer: Self-pay | Admitting: Family Medicine

## 2013-03-12 ENCOUNTER — Ambulatory Visit (INDEPENDENT_AMBULATORY_CARE_PROVIDER_SITE_OTHER): Payer: BC Managed Care – PPO | Admitting: Family Medicine

## 2013-03-12 VITALS — BP 120/70 | Temp 98.3°F | Wt 122.0 lb

## 2013-03-12 DIAGNOSIS — R109 Unspecified abdominal pain: Secondary | ICD-10-CM

## 2013-03-12 DIAGNOSIS — R197 Diarrhea, unspecified: Secondary | ICD-10-CM

## 2013-03-12 MED ORDER — CILIDINIUM-CHLORDIAZEPOXIDE 2.5-5 MG PO CAPS: 1.0000 | ORAL_CAPSULE | Freq: Three times a day (TID) | ORAL | Status: DC | PRN

## 2013-03-12 NOTE — Progress Notes (Signed)
  Subjective:    Patient ID: Michaela Thompson, female    DOB: [birth year], 57 y.o.   MRN: [id number]  HPI  Patient seen with some diarrhea symptoms and intermittent abdominal cramping past couple of weeks Recently started a new job and thinks the stress of that may be contributing. Some tendency toward loose stools in the past. Symptoms having up to 3 loose stools per day. No recent appetite or weight changes. No fevers or chills. No recent travels. No recent antibiotics.  Recently had one particularly bad episode at work of abdominal cramping following eating lunch. She felt somewhat dizzy afterwards but no syncope. Question vagal reaction She had previous colonoscopy about 5 years  Past Medical History  Diagnosis Date  . Chicken pox   . Ulcer     stomach as a child  . Chronic kidney disease    Past Surgical History  Procedure Laterality Date  . Foot surgery    . Tonsillectomy    . Breast surgery      reports that she has been smoking Cigarettes.  She has a 17.5 pack-year smoking history. She has never used smokeless tobacco. She reports that she does not drink alcohol or use illicit drugs. family history includes Alcohol abuse in her maternal grandfather; Arthritis in her mother; Cancer in her maternal grandmother; Heart disease in her father; and Stroke in her maternal grandmother. Allergies  Allergen Reactions  . Sulfa Antibiotics     hives     Review of Systems  Constitutional: Negative for fever, chills, appetite change and unexpected weight change.  Respiratory: Negative for shortness of breath.   Cardiovascular: Negative for chest pain.  Gastrointestinal: Positive for diarrhea. Negative for nausea, vomiting, constipation, blood in stool and abdominal distention.  Endocrine: Negative for polydipsia and polyuria.  Neurological: Negative for headaches.       Objective:   Physical Exam  Constitutional: She appears well-developed and well-nourished.  HENT:   Mouth/Throat: Oropharynx is clear and moist.  Neck: Neck supple.  Cardiovascular: Normal rate and regular rhythm.   Pulmonary/Chest: Effort normal and breath sounds normal. No respiratory distress. She has no wheezes. She has no rales.  Abdominal: Soft. Bowel sounds are normal. She exhibits no distension and no mass. There is no tenderness. There is no rebound and no guarding.  Musculoskeletal: She exhibits no edema.          Assessment & Plan:  Diarrhea and abdominal cramping. No history of lactose intolerance. No history of gluten intolerance. Question stress related. Short-term trial of Librax. Reassess 2 weeks. Further testing and evaluation if symptoms not improving at that time

## 2013-03-12 NOTE — Patient Instructions (Addendum)
Diarrhea Infections caused by germs (bacterial) or a virus commonly cause diarrhea. Your caregiver has determined that with time, rest and fluids, the diarrhea should improve. In general, eat normally while drinking more water than usual. Although water may prevent dehydration, it does not contain salt and minerals (electrolytes). Broths, weak tea without caffeine and oral rehydration solutions (ORS) replace fluids and electrolytes. Small amounts of fluids should be taken frequently. Large amounts at one time may not be tolerated. Plain water may be harmful in infants and the elderly. Oral rehydrating solutions (ORS) are available at pharmacies and grocery stores. ORS replace water and important electrolytes in proper proportions. Sports drinks are not as effective as ORS and may be harmful due to sugars worsening diarrhea.  ORS is especially recommended for use in children with diarrhea. As a general guideline for children, replace any new fluid losses from diarrhea and/or vomiting with ORS as follows:  If your child weighs 22 pounds or under (10 kg or less), give 60-120 mL ( -  cup or 2 - 4 ounces) of ORS for each episode of diarrheal stool or vomiting episode.  If your child weighs more than 22 pounds (more than 10 kgs), give 120-240 mL ( - 1 cup or 4 - 8 ounces) of ORS for each diarrheal stool or episode of vomiting.  While correcting for dehydration, children should eat normally. However, foods high in sugar should be avoided because this may worsen diarrhea. Large amounts of carbonated soft drinks, juice, gelatin desserts and other highly sugared drinks should be avoided.  After correction of dehydration, other liquids that are appealing to the child may be added. Children should drink small amounts of fluids frequently and fluids should be increased as tolerated. Children should drink enough fluids to keep urine clear or pale yellow.  Adults should eat normally while drinking more fluids than  usual. Drink small amounts of fluids frequently and increase as tolerated. Drink enough fluids to keep urine clear or pale yellow. Broths, weak decaffeinated tea, lemon lime soft drinks (allowed to go flat) and ORS replace fluids and electrolytes.  Avoid:  Carbonated drinks.  Juice.  Extremely hot or cold fluids.  Caffeine drinks.  Fatty, greasy foods.  Alcohol.  Tobacco.  Too much intake of anything at one time.  Gelatin desserts.  Probiotics are active cultures of beneficial bacteria. They may lessen the amount and number of diarrheal stools in adults. Probiotics can be found in yogurt with active cultures and in supplements.  Wash hands well to avoid spreading bacteria and virus.  Anti-diarrheal medications are not recommended for infants and children.  Only take over-the-counter or prescription medicines for pain, discomfort or fever as directed by your caregiver. Do not give aspirin to children because it may cause Reye's Syndrome.  For adults, ask your caregiver if you should continue all prescribed and over-the-counter medicines.  If your caregiver has given you a follow-up appointment, it is very important to keep that appointment. Not keeping the appointment could result in a chronic or permanent injury, and disability. If there is any problem keeping the appointment, you must call back to this facility for assistance. SEEK IMMEDIATE MEDICAL CARE IF:   You or your child is unable to keep fluids down or other symptoms or problems become worse in spite of treatment.  Vomiting or diarrhea develops and becomes persistent.  There is vomiting of blood or bile (green material).  There is blood in the stool or the stools are black and   tarry.  There is no urine output in 6-8 hours or there is only a small amount of very dark urine.  Abdominal pain develops, increases or localizes.  You have a fever.  Your baby is older than 3 months with a rectal temperature of 102 F  (38.9 C) or higher.  Your baby is 3 months old or younger with a rectal temperature of 100.4 F (38 C) or higher.  You or your child develops excessive weakness, dizziness, fainting or extreme thirst.  You or your child develops a rash, stiff neck, severe headache or become irritable or sleepy and difficult to awaken. MAKE SURE YOU:   Understand these instructions.  Will watch your condition.  Will get help right away if you are not doing well or get worse. Document Released: 10/01/2002 Document Revised: 01/03/2012 Document Reviewed: 08/18/2009 [employer] Patient Information 2013 [employer].  

## 2013-03-26 ENCOUNTER — Telehealth: Payer: Self-pay | Admitting: Family Medicine

## 2013-03-26 NOTE — Telephone Encounter (Signed)
Pt requesting to talk w/ Dr. Caryl Never when he has free time re his wife Morrigan.  There is a DPR on file.

## 2013-03-28 ENCOUNTER — Encounter: Payer: Self-pay | Admitting: Family Medicine

## 2013-03-28 ENCOUNTER — Ambulatory Visit (INDEPENDENT_AMBULATORY_CARE_PROVIDER_SITE_OTHER): Payer: BC Managed Care – PPO | Admitting: Family Medicine

## 2013-03-28 VITALS — BP 110/70 | HR 66 | Temp 98.2°F | Resp 18 | Wt 122.0 lb

## 2013-03-28 DIAGNOSIS — R197 Diarrhea, unspecified: Secondary | ICD-10-CM

## 2013-03-28 DIAGNOSIS — R4189 Other symptoms and signs involving cognitive functions and awareness: Secondary | ICD-10-CM

## 2013-03-28 NOTE — Progress Notes (Signed)
Subjective:    Patient ID: Michaela Thompson, female    DOB: 12-08-1955, 57 y.o.   MRN: [id number]  HPI Patient seen for followup regarding recent diarrhea Refer to previous note. Increased stress issues with new job. She denies any other red flags for diarrhea such as weight change, fever, etc. Patient actually lost her job since last visit and since stopping her recent job symptoms have improved. She has been using Librax intermittently which does seem to help. She had colonoscopy 2009 Denies any recent weight changes or appetite change. No bloody stools. No nausea or vomiting. No recent abdominal pain.  Other issues that husband called with concern for cognitive change. Refer to office note back in February. MMSE 29/30 that time He called just yesterday stating that she seems to have some memory issues and not recognizing common things. For example, he recently mentioned post office and she denied any recollection of what that represented. She's not had any recent depression issues. No recent delusions. No hallucinations. No focal weakness. No headaches. Still exercising regularly. TSH was normal 3 months ago. No specific risk factors for B12 deficiency. No illicit drug use. No regular alcohol use.  Past Medical History  Diagnosis Date  . Chicken pox   . Ulcer     stomach as a child  . Chronic kidney disease    Past Surgical History  Procedure Laterality Date  . Foot surgery    . Tonsillectomy    . Breast surgery      reports that she has been smoking Cigarettes.  She has a 17.5 pack-year smoking history. She has never used smokeless tobacco. She reports that she does not drink alcohol or use illicit drugs. family history includes Alcohol abuse in her maternal grandfather; Arthritis in her mother; Cancer in her maternal grandmother; Heart disease in her father; and Stroke in her maternal grandmother. Allergies  Allergen Reactions  . Sulfa Antibiotics     hives      Review  of Systems  Constitutional: Negative for fatigue.  Eyes: Negative for visual disturbance.  Respiratory: Negative for cough, chest tightness, shortness of breath and wheezing.   Cardiovascular: Negative for chest pain, palpitations and leg swelling.  Gastrointestinal: Positive for diarrhea. Negative for nausea, vomiting and abdominal pain.  Genitourinary: Negative for dysuria.  Neurological: Negative for dizziness, seizures, syncope, weakness, light-headedness and headaches.  Psychiatric/Behavioral: Negative for suicidal ideas, hallucinations, sleep disturbance, dysphoric mood, decreased concentration and agitation.       Objective:   Physical Exam  Constitutional: She is oriented to person, place, and time. She appears well-developed and well-nourished.  HENT:  Head: Normocephalic and atraumatic.  Eyes: Pupils are equal, round, and reactive to light.  Fundi benign  Neck: Neck supple. No thyromegaly present.  Cardiovascular: Normal rate and regular rhythm.   Pulmonary/Chest: Effort normal and breath sounds normal. No respiratory distress. She has no wheezes. She has no rales.  Musculoskeletal: She exhibits no edema.  Neurological: She is alert and oriented to person, place, and time. No cranial nerve deficit. Coordination normal.  Psychiatric: She has a normal mood and affect. Her behavior is normal. Judgment and thought content normal.          Assessment & Plan:  #1 recent diarrhea/loose stools. Improved. Suspect stress related. Symptoms have improved since she stopped her recent job. Taper back Librax as needed #2 possible cognitive changes as above. No recent depression. No agitation. No delusional thought and no delirium.  Check B12 level. Recent  TSH normal. CT head without contrast

## 2013-03-28 NOTE — Patient Instructions (Addendum)
We will call you regarding CT scan of head

## 2013-04-03 ENCOUNTER — Ambulatory Visit: Payer: BC Managed Care – PPO | Admitting: Family Medicine

## 2013-04-04 ENCOUNTER — Other Ambulatory Visit: Payer: BC Managed Care – PPO

## 2013-04-06 ENCOUNTER — Ambulatory Visit (INDEPENDENT_AMBULATORY_CARE_PROVIDER_SITE_OTHER)
Admission: RE | Admit: 2013-04-06 | Discharge: 2013-04-06 | Disposition: A | Discharge status: Home / Self Care | Payer: BC Managed Care – PPO | Source: Ambulatory Visit | Attending: Family Medicine | Admitting: Family Medicine

## 2013-04-06 DIAGNOSIS — R4189 Other symptoms and signs involving cognitive functions and awareness: Secondary | ICD-10-CM

## 2013-04-06 NOTE — Progress Notes (Signed)
Quick Note:  Pt informed on personally identified VM ______ 

## 2013-04-30 IMAGING — US US ABDOMEN COMPLETE
1 series · 13 of 25 positions shown · non-contrast
Comparison: None.

CLINICAL DATA: Epigastric pain and nausea.

COMPLETE ABDOMINAL ULTRASOUND

[Series 1: us abdomen complete · 0.28mm/px · 13 of 90 slices shown]
[im 1/90]
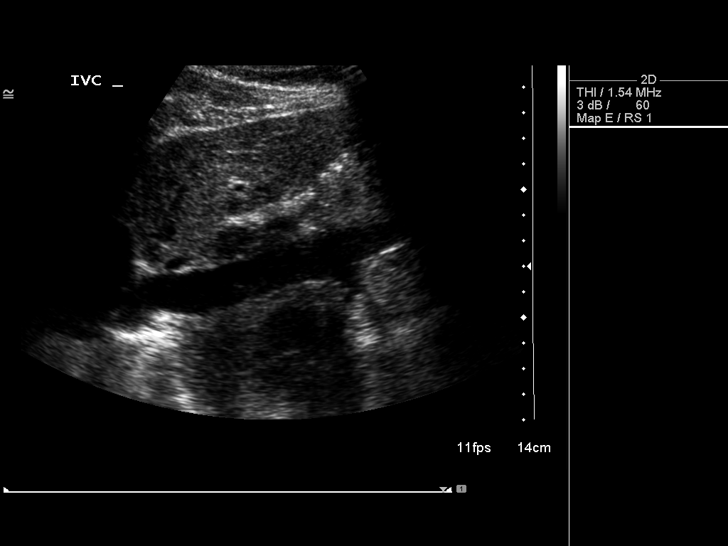
[im 8/90]
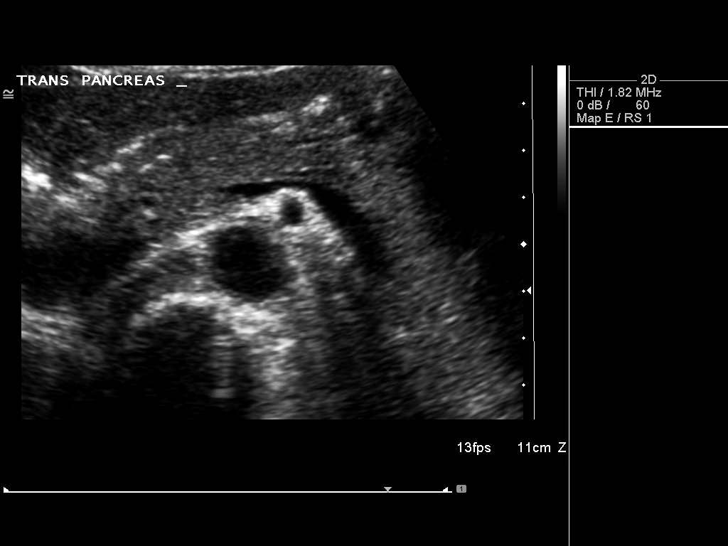
[im 15/90]
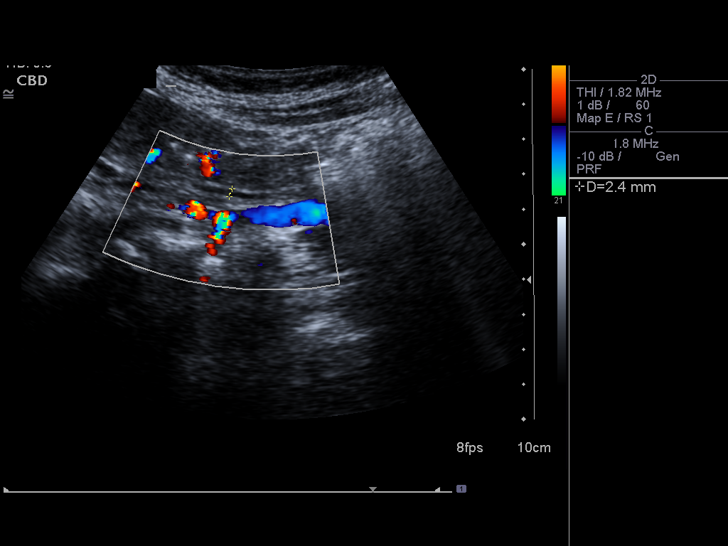
[im 23/90]
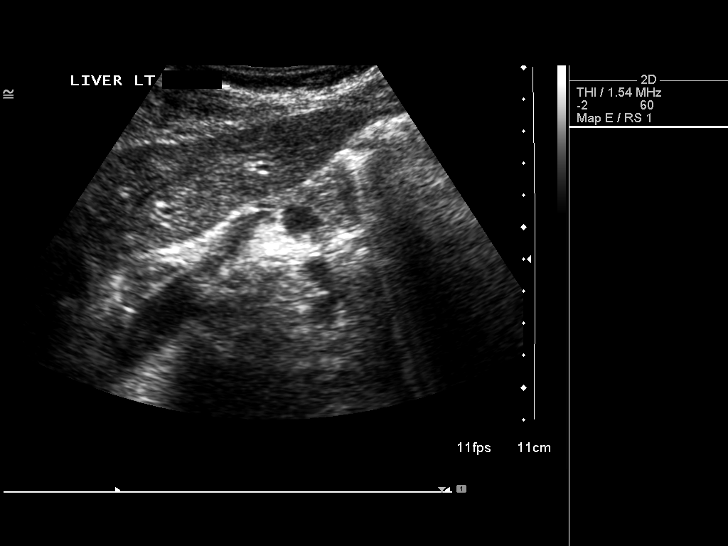
[im 30/90]
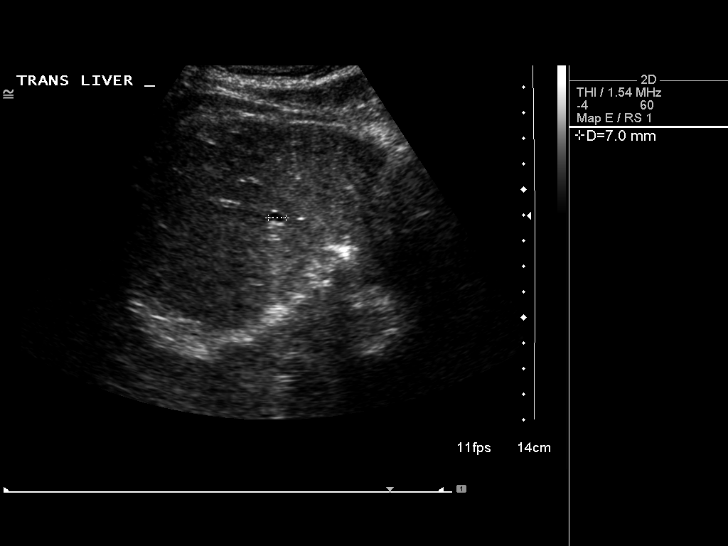
[im 38/90]
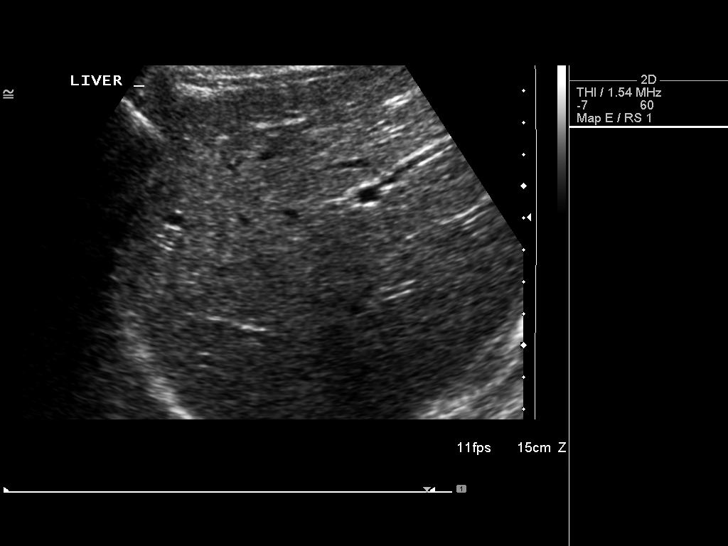
[im 45/90]
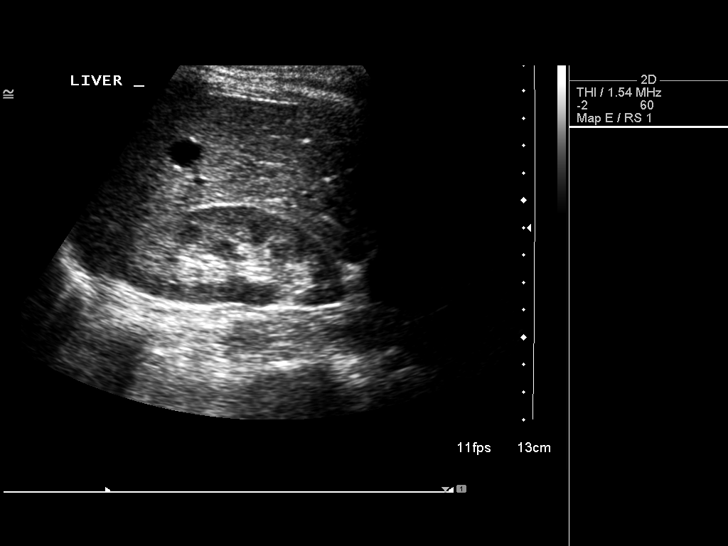
[im 52/90]
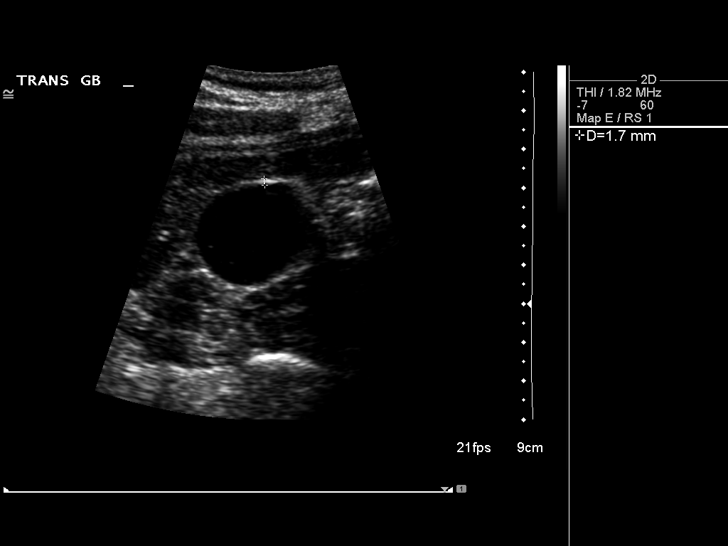
[im 60/90]
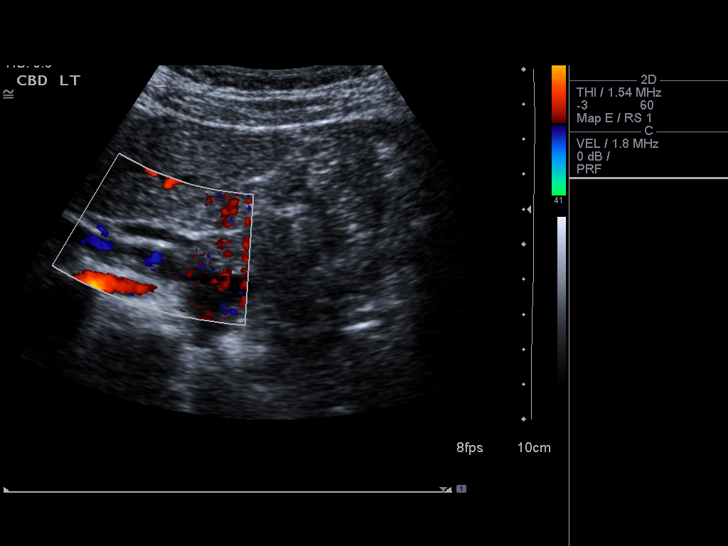
[im 67/90]
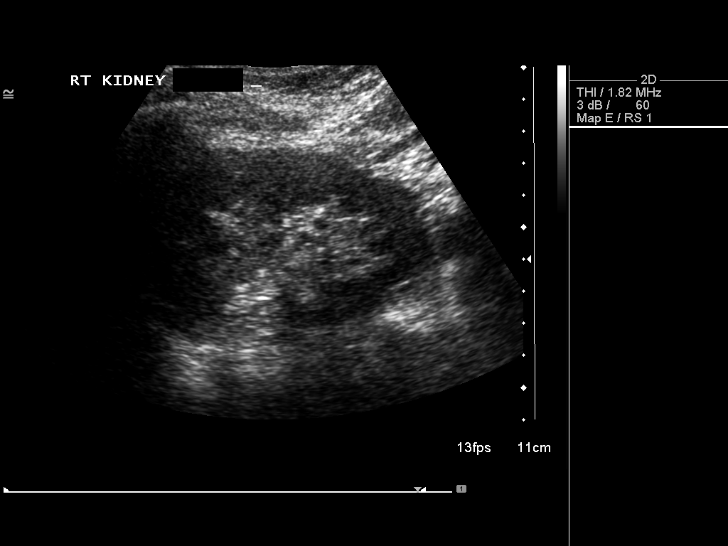
[im 75/90]
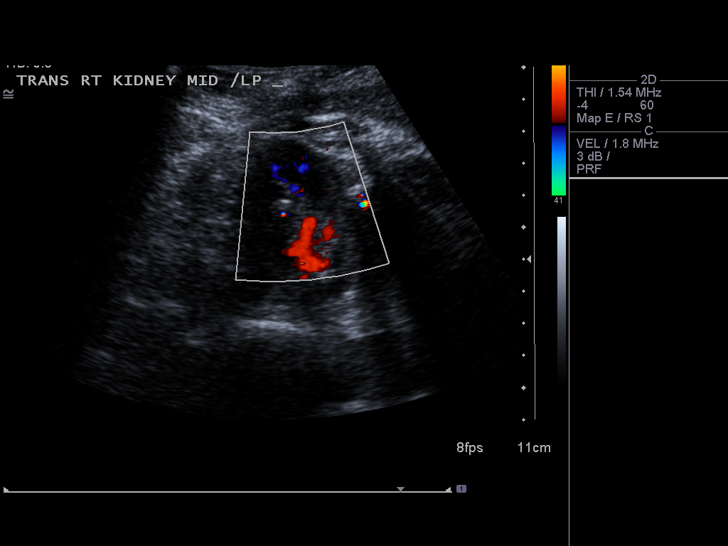
[im 82/90]
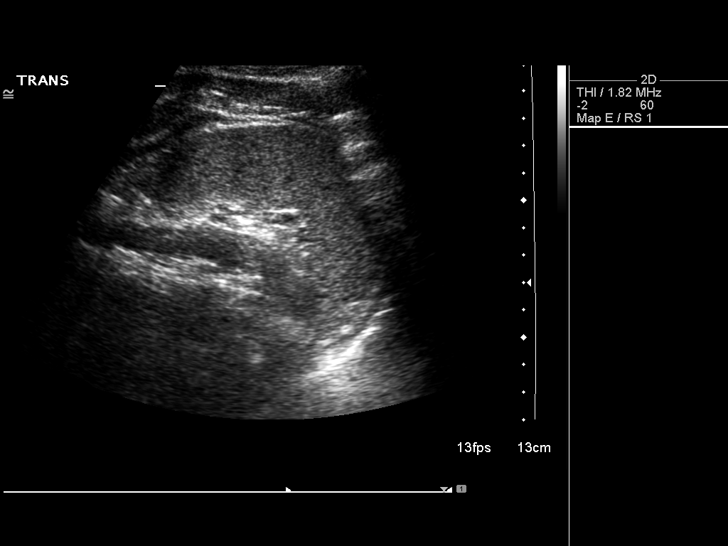
[im 90/90]
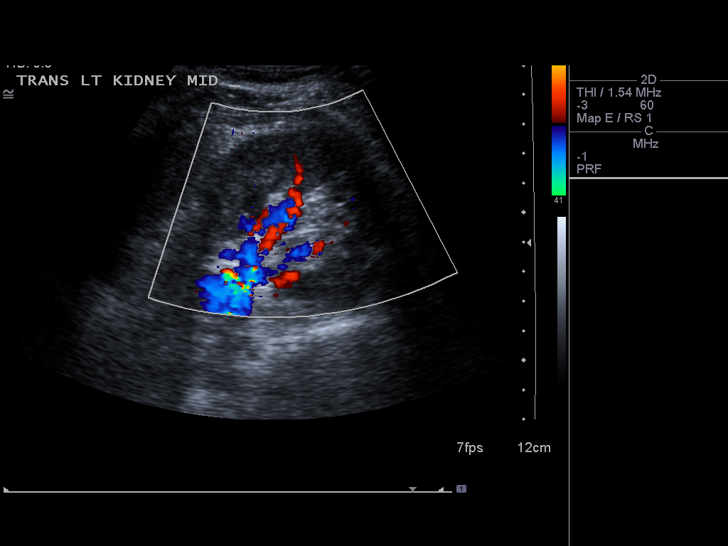

[13 of 25 positions shown; findings below may reference images not displayed]

FINDINGS: Gallbladder:  No gallstones, gallbladder wall thickening, or
pericholecystic fluid.

Common bile duct:  Normal in caliber measuring a maximum of 3.9mm.

Liver:  Multiple hepatic cysts are noted.  Some of these
demonstrate septations but no worrisome imaging features. The
largest lesion in the left lobe measures 3.0 x 2.6 x 3.3 cm. The
echogenicity of the liver is normal.  No intrahepatic biliary
dilatation.

IVC:  Normal caliber.

Pancreas:  Sonographically unremarkable.

Spleen:  Normal size and echogenicity.  No focal lesions.

Right Kidney:  10.2 cm in length. Normal renal cortical thickness
and echogenicity without focal lesions or hydronephrosis..  A mid
pole 5 mm calculus is noted.

Left Kidney:  10.2 cm in length. Normal renal cortical thickness
and echogenicity without focal lesions or hydronephrosis.

Abdominal aorta:  Normal caliber.  Atherosclerotic changes noted.
IMPRESSION: 1.  Normal sonographic appearance of the gallbladder and normal
caliber common bile duct.
2.  Hepatic cysts but no worrisome hepatic lesions.
3.  5 mm right renal calculus.  No hydronephrosis.

## 2013-05-03 IMAGING — CR DG ABDOMEN 1V
1 series · 1 of 1 positions shown · non-contrast
Comparison: None.

CLINICAL DATA: Chest pain, abdominal pain , chronic constipation

ABDOMEN - 1 VIEW

[t abdomen supine *]
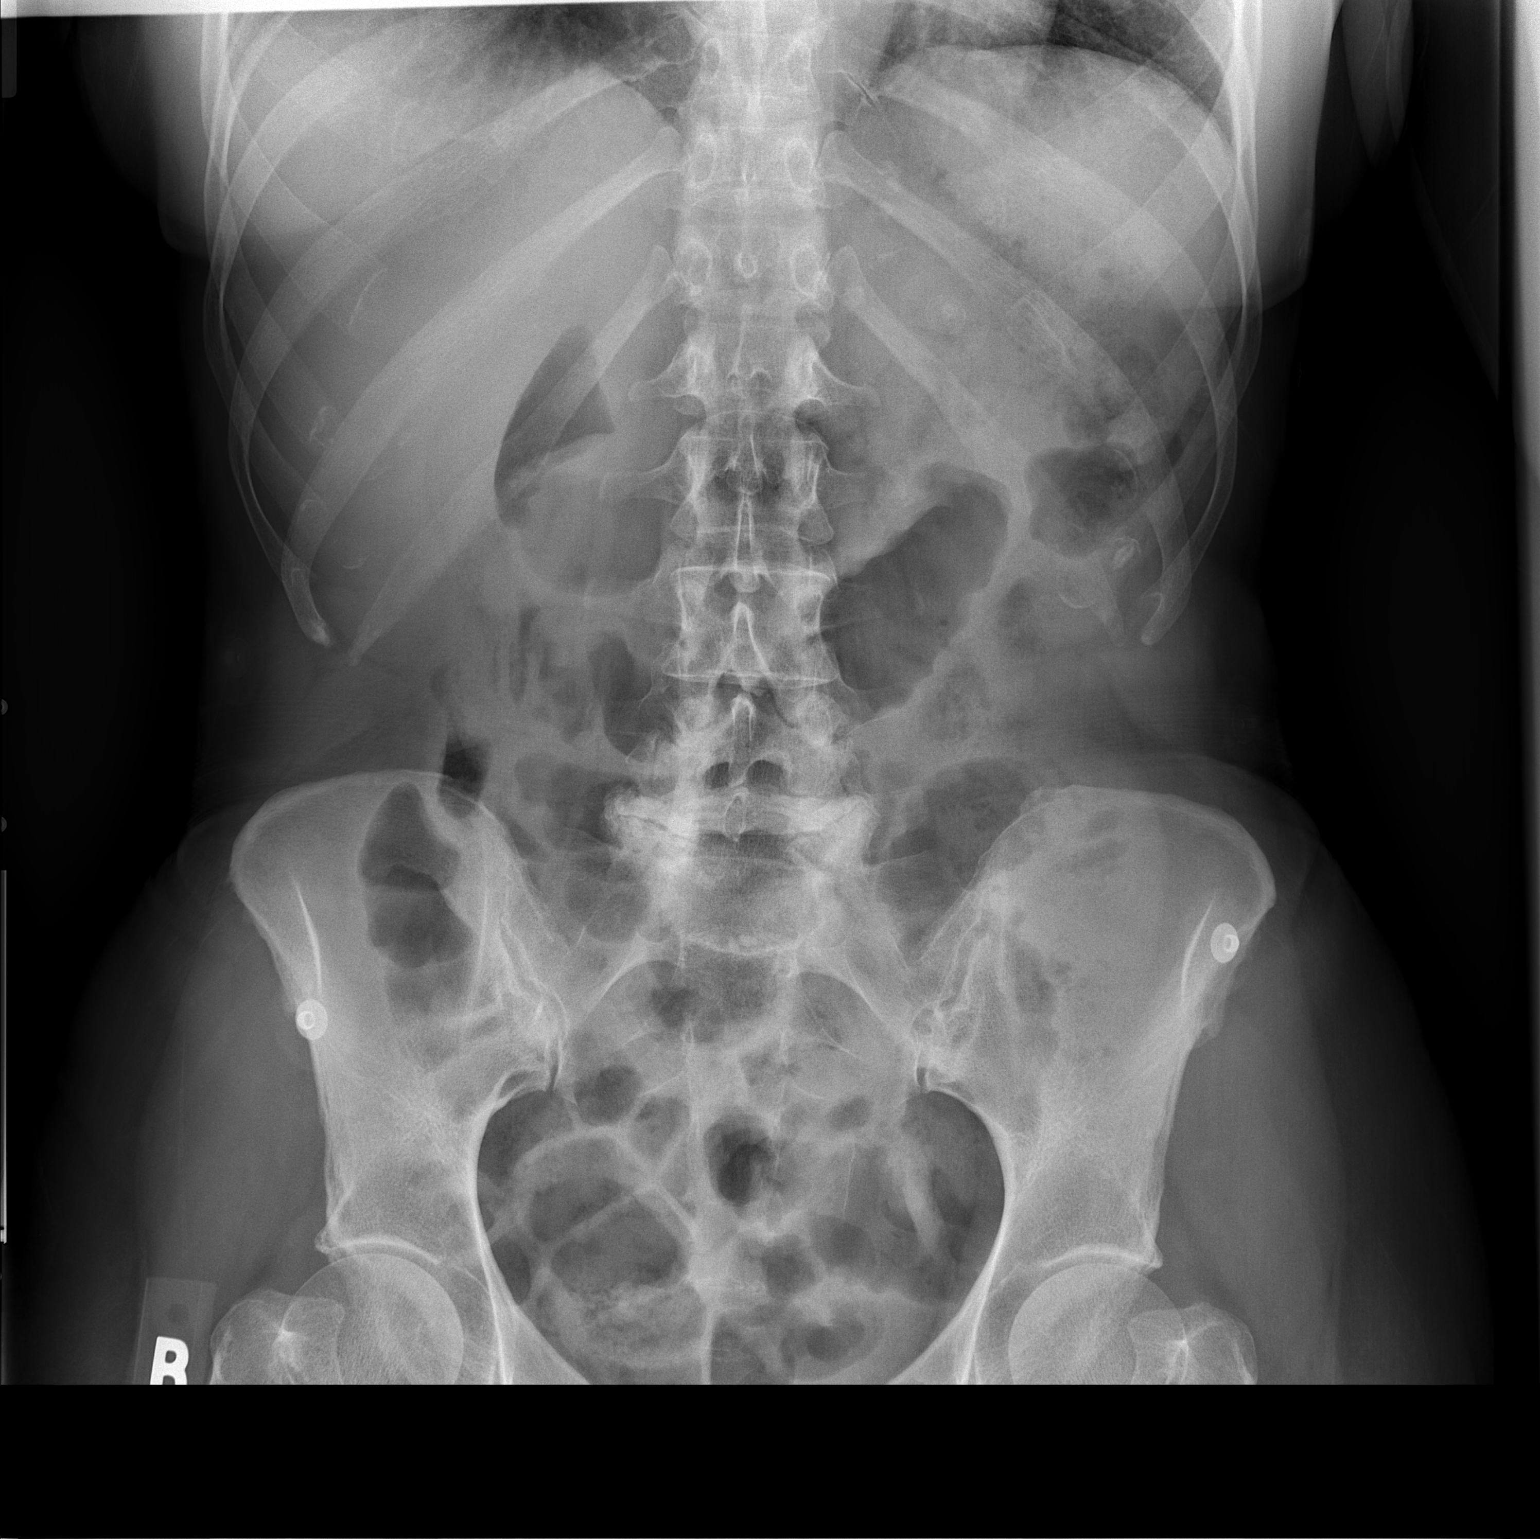

[1 of 1 positions shown; findings below may reference images not displayed]

FINDINGS: Gas noted within the colon and small bowel loops without
significant distention or air fluid levels. No significant colonic
stool. Degenerative changes lower lumbar spine.
IMPRESSION: Gas present within small bowel loops and colon without significant
air-fluid levels. No significant colonic stool.  Mild degenerative
changes lower lumbar spine.

## 2013-07-09 ENCOUNTER — Ambulatory Visit (INDEPENDENT_AMBULATORY_CARE_PROVIDER_SITE_OTHER): Payer: BC Managed Care – PPO | Admitting: Family Medicine

## 2013-07-09 ENCOUNTER — Encounter: Payer: Self-pay | Admitting: Family Medicine

## 2013-07-09 VITALS — BP 110/70 | HR 65 | Temp 98.0°F | Ht 62.0 in | Wt 117.0 lb

## 2013-07-09 DIAGNOSIS — G3184 Mild cognitive impairment, so stated: Secondary | ICD-10-CM

## 2013-07-09 NOTE — Progress Notes (Signed)
  Subjective:    Patient ID: Michaela Thompson, female    DOB: [birth year], 57 y.o.   MRN: [id number]  HPI  Patient seen for followup regarding cognitive changes. Refer to prior note. Her blood work and CT head revealed no acute findings. Question of some temporal lobe atrophy. Patient is more concerned about long-term memory deficits and not as much short-term memory issues. She had Mini-Mental Status exam last February 29/30. We elected not to start any medications. She is actively engaged in reading, exercise, and learning new tasks.  Past Medical History  Diagnosis Date  . Chicken pox   . Ulcer     stomach as a child  . Chronic kidney disease    Past Surgical History  Procedure Laterality Date  . Foot surgery    . Tonsillectomy    . Breast surgery      reports that she has been smoking Cigarettes.  She has a 17.5 pack-year smoking history. She has never used smokeless tobacco. She reports that she does not drink alcohol or use illicit drugs. family history includes Alcohol abuse in her maternal grandfather; Arthritis in her mother; Cancer in her maternal grandmother; Heart disease in her father; Stroke in her maternal grandmother. Allergies  Allergen Reactions  . Sulfa Antibiotics     hives     Review of Systems  Constitutional: Negative for fatigue.  Eyes: Negative for visual disturbance.  Respiratory: Negative for cough, chest tightness, shortness of breath and wheezing.   Cardiovascular: Negative for chest pain, palpitations and leg swelling.  Neurological: Negative for dizziness, seizures, syncope, weakness, light-headedness and headaches.       Objective:   Physical Exam  Constitutional: She is oriented to person, place, and time. She appears well-developed and well-nourished.  Cardiovascular: Normal rate and regular rhythm.   Pulmonary/Chest: Effort normal and breath sounds normal. No respiratory distress. She has no wheezes. She has no rales.  Neurological: She is  alert and oriented to person, place, and time. No cranial nerve deficit.  Psychiatric: She has a normal mood and affect. Her behavior is normal. Judgment and thought content normal.          Assessment & Plan:  Cognitive concerns. Patient is alert and oriented to person place and time. She is oriented to date. 3 word recall is normal. Followup in 6 months and repeat MMSE at that time. Recent TSH, B12, CT head no acute findings.

## 2013-09-10 ENCOUNTER — Telehealth: Payer: Self-pay | Admitting: Family Medicine

## 2013-09-10 NOTE — Telephone Encounter (Signed)
Left message for patient to return call.

## 2013-09-10 NOTE — Telephone Encounter (Signed)
Pt hus is requesting a referral to [facility] memory assessment clinic at [place] [phone].dx mild congnitive impairment

## 2013-09-10 NOTE — Telephone Encounter (Signed)
We can make this referral if pt is in agreement.  I do not think pt has degree of cognitive impairment that she cannot have input on this decision.

## 2013-09-12 NOTE — Telephone Encounter (Signed)
Pt stated not at this time is not wanting a referral. Pt stated her husband thinks she should get the referral because she forgets things in that in the past a few years ago or if it is something that she had not made in awhile like some food.

## 2013-09-14 NOTE — Telephone Encounter (Signed)
Husband called back to inquire about referral. DPR on file and informed him pt denied referral. He will speak with her.

## 2013-09-18 NOTE — Telephone Encounter (Signed)
OK to refer IF patient is in agreement.  Previously she was not.  Let's confirm with pt before we set up.

## 2013-09-18 NOTE — Telephone Encounter (Signed)
Pt is callback to get the referral to [facility] memory assessment clinic at [place]

## 2013-09-19 ENCOUNTER — Other Ambulatory Visit: Payer: Self-pay | Admitting: Family Medicine

## 2013-09-19 DIAGNOSIS — R413 Other amnesia: Secondary | ICD-10-CM

## 2013-09-19 NOTE — Telephone Encounter (Signed)
Referral is ordered

## 2013-09-19 NOTE — Telephone Encounter (Signed)
Pt is in agreement to go to the memory assessment clinic. Pt states its more of a long term memory.

## 2013-09-19 NOTE — Telephone Encounter (Signed)
OK to set up 

## 2013-09-26 NOTE — Telephone Encounter (Signed)
S/w husband and explained to him that the scheduling coordinator for the memory assessment clinic, is out until next week. He is aware that scheduling may take longer than expected.

## 2013-09-26 NOTE — Telephone Encounter (Signed)
Husband would like to request that an appt be set up next tues, dec 9, due to work schedule. Advised husband it may take longer. Please contact husband as soon as you know something. Thanks  [phone]

## 2013-10-22 ENCOUNTER — Telehealth: Payer: Self-pay | Admitting: Family Medicine

## 2013-10-22 NOTE — Telephone Encounter (Signed)
Left message for patient to return call.

## 2013-10-22 NOTE — Telephone Encounter (Signed)
Pt needs a referral to [facility]. [address] [phone]. The last referral was for memory problems. Please call spouse with any questions.

## 2013-10-22 NOTE — Telephone Encounter (Signed)
Did she not go to [facility]?  That is where we had made referral to assessment of her cognitive issues.

## 2013-10-23 NOTE — Telephone Encounter (Signed)
OK to set up referral. 

## 2013-10-23 NOTE — Telephone Encounter (Signed)
Pt husband stated that [facility] could not fit patient in there schedule. So now the husband wants to the patient to go to [facility].

## 2013-10-24 ENCOUNTER — Other Ambulatory Visit: Payer: Self-pay | Admitting: Family Medicine

## 2013-10-24 DIAGNOSIS — R413 Other amnesia: Secondary | ICD-10-CM

## 2013-10-24 NOTE — Telephone Encounter (Signed)
Order is placed.

## 2013-11-05 ENCOUNTER — Ambulatory Visit (INDEPENDENT_AMBULATORY_CARE_PROVIDER_SITE_OTHER): Payer: BC Managed Care – PPO | Admitting: Neurology

## 2013-11-05 ENCOUNTER — Encounter: Payer: Self-pay | Admitting: Neurology

## 2013-11-05 VITALS — BP 106/69 | HR 58 | Ht 63.0 in | Wt 129.0 lb

## 2013-11-05 DIAGNOSIS — F0280 Dementia in other diseases classified elsewhere without behavioral disturbance: Secondary | ICD-10-CM | POA: Insufficient documentation

## 2013-11-05 DIAGNOSIS — G3109 Other frontotemporal dementia: Secondary | ICD-10-CM

## 2013-11-05 DIAGNOSIS — R413 Other amnesia: Secondary | ICD-10-CM

## 2013-11-05 DIAGNOSIS — F411 Generalized anxiety disorder: Secondary | ICD-10-CM | POA: Insufficient documentation

## 2013-11-05 NOTE — Progress Notes (Signed)
[facility]  PATIENT: Michaela JacksonCarol S Thompson DOB: [birth year]  HISTORICAL    Michaela RegalCarol is all 58 years old Caucasian female, accompanied by her husband, referred by her primary care physician Dr. Sander RadonBurchett for evaluation of memory trouble  Herself denies significant memory trouble, reporting her husband suggested to visit her husband works for Biomedical engineerindustrial equipment, over the years, they have moved multiple times, across the state, [place] have moved [place] years now she used to work as a Psychologist, counsellingkitchen designer, had 13 years of education, last project was about 3 years ago.  Her husband travel at least 5 days in the week, over the past few weeks, when he had a prolonged stay at home, he noticed that patient has trouble understanding, confused, improper behavior sometimes, such as getting too close to people, dancing at church, argumentative,  She still function at home, able to keep up bills without difficulty, she has no trouble handling her daily activity, She was treated with Zoloft for her anxiety recently, husband reported mild to moderate improvement  She denies headache, no visual loss, no gait difficulty, admantly denies that she has any memory trouble, she contributor to her symptoms to be loneliness    We have reviewed CAT scan of the brain, which has demonstrates significant atrophy, mainly involving bilateral sylvian fissure, and frontal region, laboratory showed normal CBC CMP B12,   REVIEW OF SYSTEMS: Full 14 system review of systems performed and notable only for memory loss, confusion, passing out, insomnia, sleepiness, depression, anxiety, joint pain,  ALLERGIES: Allergies  Allergen Reactions  . Sulfa Antibiotics     hives    HOME MEDICATIONS: Outpatient Prescriptions Prior to Visit  Medication Sig Dispense Refill  . Ascorbic Acid (VITAMIN C) 1000 MG tablet Take 1,000 mg by mouth daily.      . Ergocalciferol (VITAMIN D2) 2000 UNITS TABS Take 1  tablet by mouth every other day.      . sertraline (ZOLOFT) 100 MG tablet Take 1 tablet by mouth every evening  Since July 2014       PAST MEDICAL HISTORY: Past Medical History  Diagnosis Date  . Chicken pox   . Ulcer     stomach as a child  . Chronic kidney disease   . Memory loss   .     anxiety  PAST SURGICAL HISTORY: Past Surgical History  Procedure Laterality Date  . Foot surgery    . Tonsillectomy    . Breast surgery, left benign    . Breast enhancement surgery      FAMILY HISTORY: Family History  Problem Relation Age of Onset  . Arthritis Mother   . Heart disease Father     sudden death > 50  . Cancer Maternal Grandmother     breast  . Stroke Maternal Grandmother   . Alcohol abuse Maternal Grandfather     SOCIAL HISTORY:  History   Social History  . Marital Status: Married    Spouse Name: Michaela Thompson    Number of Children: 2  . Years of Education: 13   Occupational History  . Psychologist, counsellingKitchen Designer, Social research officer, governmentinterior design    Social History Main Topics  . Smoking status: Current Every Day Smoker -- 0.50 packs/day for 35 years    Types: Cigarettes  . Smokeless tobacco: Never Used  . Alcohol Use: No  . Drug Use: No  . Sexual Activity: Not on file   Other Topics Concern  . Not on file   Social  History Narrative   Patient lives at home with her husband Michaela Thompson).   Patient is not working right now but she Psychologist, counselling.   Education. Some college.   Right handed.   Caffeine- Coffee Two cups daily.      PHYSICAL EXAM   Filed Vitals:   11/05/13 1227  BP: 106/69  Pulse: 58  Height: 5\' 3"  (1.6 m)  Weight: 129 lb (58.514 kg)    Not recorded    Body mass index is 22.86 kg/(m^2).   Generalized: In no acute distress  Neck: Supple, no carotid bruits   Cardiac: Regular rate rhythm  Pulmonary: Clear to auscultation bilaterally  Musculoskeletal: No deformity  Neurological examination  Mentation: Alert oriented to time, place, history taking, and  causual conversation,MMSE 30/30  Cranial nerve II-XII: Pupils were equal round reactive to light extraocular movements were full, Visual field were full on confrontational test. Bilateral fundi were sharp.  Facial sensation and strength were normal. Hearing was intact to finger rubbing bilaterally. Uvula tongue midline.  head turning and shoulder shrug and were normal and symmetric.Tongue protrusion into cheek strength was normal.  Motor: normal tone, bulk and strength.  Sensory: Intact to fine touch, pinprick, preserved vibratory sensation, and proprioception at toes.  Coordination: Normal finger to nose, heel-to-shin bilaterally there was no truncal ataxia  Gait: Rising up from seated position without assistance, normal stance, without trunk ataxia, moderate stride, good arm swing, smooth turning, able to perform tiptoe, and heel walking without difficulty.   Romberg signs: Negative  Deep tendon reflexes: Brachioradialis 2/2, biceps 2/2, triceps 2/2, patellar 2/2, Achilles 2/2, plantar responses were flexor bilaterally.   DIAGNOSTIC DATA (LABS, IMAGING, TESTING) - I reviewed patient records, labs, notes, testing and imaging myself where available.  Lab Results  Component Value Date   WBC 4.5 12/11/2012   HGB 13.8 12/11/2012   HCT 41.0 12/11/2012   MCV 91.2 12/11/2012   PLT 183.0 12/11/2012      Component Value Date/Time   NA 140 12/11/2012 0830   K 3.9 12/11/2012 0830   CL 105 12/11/2012 0830   CO2 30 12/11/2012 0830   GLUCOSE 83 12/11/2012 0830   BUN 17 12/11/2012 0830   CREATININE 0.7 12/11/2012 0830   CALCIUM 9.2 12/11/2012 0830   PROT 6.0 12/11/2012 0830   ALBUMIN 3.7 12/11/2012 0830   AST 17 12/11/2012 0830   ALT 14 12/11/2012 0830   ALKPHOS 44 12/11/2012 0830   BILITOT 0.7 12/11/2012 0830   GFRNONAA >90 03/04/2012 1100   GFRAA >90 03/04/2012 1100   Lab Results  Component Value Date   CHOL 233* 12/11/2012   HDL 71.80 12/11/2012   LDLDIRECT 135.9 12/11/2012   TRIG 65.0 12/11/2012    CHOLHDL 3 12/11/2012   No results found for this basename: HGBA1C   Lab Results  Component Value Date   VITAMINB12 511 03/28/2013   Lab Results  Component Value Date   TSH 1.37 12/11/2012      ASSESSMENT AND PLAN   58 years old Caucasian female, with gradual onset memory trouble, Mini-Mental Status is a 30 out of 30 today, recent laboratory evaluation showed a normal CBC, CMP, B12 CAT scan showed significant atrophy,  mainly involving bilateral sylvian fissure, bilateral frontal regions  1 differentiation diagnosis including central nervous system degenerative disorder, such as frontotemporal dementia, 2 refer  her to neuropsychology evaluation  3. Laboratory evaluation   4.  MRI of brain  5 return to clinic in 2-3 months  Marcial Pacas, M.D. Ph.D.  [facility] [address] [place], [place] [address] [phone]

## 2013-11-06 LAB — FOLATE: Folate: >19.9 ng/mL (ref >3.0)

## 2013-11-06 LAB — THYROID PEROXIDASE ANTIBODY: THYROID PEROXIDASE AB: 8 [IU]/mL (ref 0–34)

## 2013-11-06 LAB — THYROGLOBULIN ANTIBODY

## 2013-11-06 LAB — ANA W/REFLEX IF POSITIVE: Anti Nuclear Antibody(ANA): NEGATIVE

## 2013-11-06 LAB — RPR: RPR: NONREACTIVE

## 2013-11-06 LAB — THYROID PANEL WITH TSH
Free Thyroxine Index: 1.6 (ref 1.2–4.9)
T3 Uptake Ratio: 26 % (ref 24–39)
T4 TOTAL: 6.3 ug/dL (ref 4.5–12.0)
TSH: 1.2 u[IU]/mL (ref 0.450–4.500)

## 2013-11-06 LAB — HIV ANTIBODY (ROUTINE TESTING W REFLEX)
HIV 1/HIV 2 AB: NONREACTIVE
HIV 1/O/2 Abs-Index Value: <1 (ref <1.00)

## 2013-11-21 ENCOUNTER — Encounter: Payer: Self-pay | Admitting: Neurology

## 2013-11-28 ENCOUNTER — Ambulatory Visit (INDEPENDENT_AMBULATORY_CARE_PROVIDER_SITE_OTHER): Payer: BC Managed Care – PPO

## 2013-11-28 DIAGNOSIS — R413 Other amnesia: Secondary | ICD-10-CM

## 2013-11-28 DIAGNOSIS — F411 Generalized anxiety disorder: Secondary | ICD-10-CM

## 2013-11-30 ENCOUNTER — Telehealth: Payer: Self-pay | Admitting: Neurology

## 2013-11-30 NOTE — Telephone Encounter (Signed)
Dana: Please  her followup appointment with me in next available, please also make sure husband comes in with her

## 2013-12-03 NOTE — Telephone Encounter (Signed)
Called and left message on home phone and cell phone patient needs a sooner appointment with Dr.Yan and her husband needs to be with her.

## 2013-12-04 NOTE — Telephone Encounter (Signed)
Called husband back for a sooner appt. With Dr.Yan per patients husband they want be able to come any sooner because they will be traveling a lot. Per patients husband he will be with her at her follow up in April with Dr.Yan.

## 2013-12-05 ENCOUNTER — Encounter: Payer: Self-pay | Admitting: Family Medicine

## 2013-12-05 ENCOUNTER — Ambulatory Visit (INDEPENDENT_AMBULATORY_CARE_PROVIDER_SITE_OTHER): Payer: BC Managed Care – PPO | Admitting: Family Medicine

## 2013-12-05 ENCOUNTER — Telehealth: Payer: Self-pay | Admitting: Neurology

## 2013-12-05 VITALS — BP 102/70 | HR 65 | Temp 98.0°F | Wt 124.0 lb

## 2013-12-05 DIAGNOSIS — L84 Corns and callosities: Secondary | ICD-10-CM

## 2013-12-05 NOTE — Telephone Encounter (Signed)
Pt's husband, Luan PullingRich, called to speak to [place].  He stated that he spoke with her yesterday regarding an appointment.  Please reference message from 11-30-13.  Please call and speak with him directly. Thank you.

## 2013-12-05 NOTE — Progress Notes (Signed)
   Subjective:    Patient ID: Michaela JacksonCarol S Thompson, female    DOB: [birth year], 58 y.o.   MRN: [id number]  Toe Pain    Patient seen with pain between her left great toe and second toe. She describes of either remote surgery years ago. She has a chronic" bumps" involving her left second toe. This rubs against her great toe she has a calloused area left great toe medially which is become painful. She is still walking sometimes several miles per day.  Past Medical History  Diagnosis Date  . Chicken pox   . Ulcer     stomach as a child  . Chronic kidney disease   . Memory loss    Past Surgical History  Procedure Laterality Date  . Foot surgery    . Tonsillectomy    . Breast surgery    . Breast enhancement surgery      reports that she has been smoking Cigarettes.  She has a 17.5 pack-year smoking history. She has never used smokeless tobacco. She reports that she does not drink alcohol or use illicit drugs. family history includes Alcohol abuse in her maternal grandfather; Arthritis in her mother; Cancer in her maternal grandmother; Heart disease in her father; Stroke in her maternal grandmother. Allergies  Allergen Reactions  . Sulfa Antibiotics     hives      Review of Systems  Constitutional: Negative for fever and chills.       Objective:   Physical Exam  Constitutional: She appears well-developed and well-nourished.  Cardiovascular: Normal rate.   Pulmonary/Chest: Effort normal and breath sounds normal. No respiratory distress. She has no wheezes. She has no rales.  Skin:  Patient has a large thickened callused area on the medial aspect of her left great toe. This appears to be rubbing against a bony protuberance from her left second toe. No signs of secondary infection          Assessment & Plan:  Callus left great toe. Would recommend soaking this in warm water and keep calloused area filed down with pummice. She'll try Dr. Margart SicklesScholl's pad for cushioning. Avoid tight  fitting shoes. We gave her names of local podiatrist if she desires consult

## 2013-12-05 NOTE — Patient Instructions (Signed)
Corns and Calluses Corns are small areas of thickened skin that usually occur on the top, sides, or tip of a toe. They contain a cone-shaped core with a point that can press on a nerve below. This causes pain. Calluses are areas of thickened skin that usually develop on hands, fingers, palms, soles of the feet, and heels. These are areas that experience frequent friction or pressure. CAUSES  Corns are usually the result of rubbing (friction) or pressure from shoes that are too tight or do not fit properly. Calluses are caused by repeated friction and pressure on the affected areas. SYMPTOMS  A hard growth on the skin.  Pain or tenderness under the skin.  Sometimes, redness and swelling.  Increased discomfort while wearing tight-fitting shoes. DIAGNOSIS  Your caregiver can usually tell what the problem is by doing a physical exam. TREATMENT  Removing the cause of the friction or pressure is usually the only treatment needed. However, sometimes medicines can be used to help soften the hardened, thickened areas. These medicines include salicylic acid plasters and 12% ammonium lactate lotion. These medicines should only be used under the direction of your caregiver. HOME CARE INSTRUCTIONS   Try to remove pressure from the affected area.  You may wear donut-shaped corn pads to protect your skin.  You may use a pumice stone or nonmetallic nail file to gently reduce the thickness of a corn.  Wear properly fitted footwear.  If you have calluses on the hands, wear gloves during activities that cause friction.  If you have diabetes, you should regularly examine your feet. Tell your caregiver if you notice any problems with your feet. SEEK IMMEDIATE MEDICAL CARE IF:   You have increased pain, swelling, redness, or warmth in the affected area.  Your corn or callus starts to drain fluid or bleeds.  You are not getting better, even with treatment. Document Released: 07/17/2004 Document  Revised: 01/03/2012 Document Reviewed: 06/08/2011 [facility] Patient Information 2014 [employer] [place].  Soak foot in warm water for 20-30 minutes then gently file with pummice Consider Dr Bonner PunaShoals type donut pad between toes.

## 2013-12-05 NOTE — Progress Notes (Signed)
Pre visit review using our clinic review tool, if applicable. No additional management support is needed unless otherwise documented below in the visit note. 

## 2013-12-06 ENCOUNTER — Telehealth: Payer: Self-pay | Admitting: Family Medicine

## 2013-12-06 NOTE — Telephone Encounter (Signed)
Relevant patient education assigned to patient using Emmi. ° °

## 2013-12-07 NOTE — Telephone Encounter (Signed)
Please advise 

## 2013-12-07 NOTE — Telephone Encounter (Signed)
Called patients husband back and told him that we were working on order for Dr.Zelson explained to him insurance has to be checked first.

## 2013-12-25 DIAGNOSIS — R413 Other amnesia: Secondary | ICD-10-CM

## 2014-01-03 DIAGNOSIS — R413 Other amnesia: Secondary | ICD-10-CM

## 2014-01-07 ENCOUNTER — Ambulatory Visit (INDEPENDENT_AMBULATORY_CARE_PROVIDER_SITE_OTHER): Payer: BC Managed Care – PPO | Admitting: Family Medicine

## 2014-01-07 ENCOUNTER — Encounter: Payer: Self-pay | Admitting: Family Medicine

## 2014-01-07 VITALS — BP 100/70 | HR 73 | Wt 125.0 lb

## 2014-01-07 DIAGNOSIS — F411 Generalized anxiety disorder: Secondary | ICD-10-CM

## 2014-01-07 NOTE — Progress Notes (Signed)
   Subjective:    Patient ID: Michaela Thompson, female    DOB: [birth year], 58 y.o.   MRN: [id number]  HPI Patient is here for six-month followup. We'll actually schedule her 6 months ago to reassess her mental status exam. Since that time, she has seen neurologist and has followup with him tomorrow. She had MRI of the brain which showed temporal lobe atrophy. Her Mini-Mental Status exam per neurology was 30 out of 30. She's had recent normal thyroid functions and B12. Patient fills that her memory is stable. She still exercising fairly regularly. Still smokes about 7-8 cigarettes per day  Past Medical History  Diagnosis Date  . Chicken pox   . Ulcer     stomach as a child  . Chronic kidney disease   . Memory loss    Past Surgical History  Procedure Laterality Date  . Foot surgery    . Tonsillectomy    . Breast surgery    . Breast enhancement surgery      reports that she has been smoking Cigarettes.  She has a 17.5 pack-year smoking history. She has never used smokeless tobacco. She reports that she does not drink alcohol or use illicit drugs. family history includes Alcohol abuse in her maternal grandfather; Arthritis in her mother; Cancer in her maternal grandmother; Heart disease in her father; Stroke in her maternal grandmother. Allergies  Allergen Reactions  . Sulfa Antibiotics     hives      Review of Systems  Constitutional: Negative for appetite change and unexpected weight change.  Respiratory: Negative for shortness of breath.   Cardiovascular: Negative for chest pain.  Neurological: Negative for dizziness and headaches.  Psychiatric/Behavioral: Negative for dysphoric mood and agitation.       Objective:   Physical Exam  Constitutional: She is oriented to person, place, and time. She appears well-developed and well-nourished.  Neck: Neck supple. No thyromegaly present.  Cardiovascular: Normal rate and regular rhythm.   No murmur heard. Pulmonary/Chest: Effort  normal and breath sounds normal. No respiratory distress. She has no wheezes. She has no rales.  Musculoskeletal: She exhibits no edema.  Neurological: She is alert and oriented to person, place, and time. No cranial nerve deficit.          Assessment & Plan:  #1 chronic unspecified anxiety disorder. Stable on sertraline. #2 temporal lobe atrophy. She has maintained good performance on Mini-Mental status exams. We'll defer further testing to neurology at this point

## 2014-01-07 NOTE — Progress Notes (Signed)
Pre visit review using our clinic review tool, if applicable. No additional management support is needed unless otherwise documented below in the visit note. 

## 2014-01-08 ENCOUNTER — Telehealth: Payer: Self-pay | Admitting: Family Medicine

## 2014-01-08 ENCOUNTER — Ambulatory Visit (INDEPENDENT_AMBULATORY_CARE_PROVIDER_SITE_OTHER): Payer: BC Managed Care – PPO | Admitting: Neurology

## 2014-01-08 ENCOUNTER — Encounter: Payer: Self-pay | Admitting: Neurology

## 2014-01-08 VITALS — BP 104/66 | HR 65 | Ht 64.0 in | Wt 125.0 lb

## 2014-01-08 DIAGNOSIS — R413 Other amnesia: Secondary | ICD-10-CM

## 2014-01-08 DIAGNOSIS — F411 Generalized anxiety disorder: Secondary | ICD-10-CM

## 2014-01-08 MED ORDER — DONEPEZIL HCL 10 MG PO TABS: 10.0000 mg | ORAL_TABLET | Freq: Every day | ORAL | Status: DC

## 2014-01-08 NOTE — Patient Instructions (Signed)
[email or web address]

## 2014-01-08 NOTE — Telephone Encounter (Signed)
Relevant patient education assigned to patient using Emmi. ° °

## 2014-01-08 NOTE — Progress Notes (Signed)
[facility]  PATIENT: Michaela JacksonCarol S Thompson DOB: [birth year]  HISTORICAL    Michaela RegalCarol is all 58 years old Caucasian female, accompanied by her husband, referred by her primary care physician Dr. Sander RadonBurchett for evaluation of memory trouble  Herself denies significant memory trouble,her husband has urged her to evaluate her memory trouble.  Her husband works for [employer]ical engineer[employer] equipment, over the years, they have moved multiple times, across the state, [place]  [place] years ago,  she used to work as a Psychologist, counsellingkitchen designer, had 13 years of education, last project was about 3 years ago.  Her husband travels at least 5 days in a week, over the past few weeks, when he had a prolonged stay at home, he noticed that patient has trouble understanding, confused, improper behavior sometimes, such as getting too close to people, dancing at church, argumentative,  She still function at home, able to keep up bills without difficulty, she has no trouble handling her daily activity, She was treated with Zoloft for her anxiety recently, husband reported mild to moderate improvement  She denies headache, no visual loss, no gait difficulty, admantly denies that she has any memory trouble, she contributor to her symptoms to be loneliness   We have reviewed CAT scan of the brain, which has demonstrates significant atrophy, mainly involving bilateral sylvian fissure, and frontal region, laboratory showed normal CBC CMP B12,  UPDATE March 17th 2015:   Laboratory evaluation demonstrated normal thyroid function tests, thyroglobulin antibody, thyroid peroxidase antibody RPR, HIV, folic acid,  We have reviewed MRI of the brain together,Moderate-severe perisylvian and anterior temporal atrophy.  Mild scattered round foci of periventricular and subcortical T2 hyperintensitie  She was evaluated by neuropsychologist Dr. Leonides CaveZelson, there was notable striking deficit in the area of expressive language, evident  on test of naming to confrontation, and semantic fluency, she had deficiency to retrieve previously learned information from long-term semantic memory, her receptive language was intact.  inappropriate affect was also noticed. Taken together, her neurocognitive profile, alternations in social behavior/personality and result from neuroradiological studies raised the concern for a neurodegenerative disorder, most specifically, frontotemporal dementia, temporal variant, incipient Alzheimer's dementia is also a consideration  REVIEW OF SYSTEMS: Full 14 system review of systems performed and notable only for memory loss, confusion, passing out, insomnia, sleepiness, depression, anxiety, joint pain,  ALLERGIES: Allergies  Allergen Reactions  . Sulfa Antibiotics     hives    HOME MEDICATIONS: Outpatient Prescriptions Prior to Visit  Medication Sig Dispense Refill  . Ascorbic Acid (VITAMIN C) 1000 MG tablet Take 1,000 mg by mouth daily.      . Ergocalciferol (VITAMIN D2) 2000 UNITS TABS Take 1 tablet by mouth every other day.      . sertraline (ZOLOFT) 100 MG tablet Take 1 tablet by mouth every evening  Since July 2014       PAST MEDICAL HISTORY: Past Medical History  Diagnosis Date  . Chicken pox   . Ulcer     stomach as a child  . Chronic kidney disease   . Memory loss   .     anxiety  PAST SURGICAL HISTORY: Past Surgical History  Procedure Laterality Date  . Foot surgery    . Tonsillectomy    . Breast surgery, left benign    . Breast enhancement surgery      FAMILY HISTORY: Family History  Problem Relation Age of Onset  . Arthritis Mother   . Heart disease Father  sudden death > 50  . Cancer Maternal Grandmother     breast  . Stroke Maternal Grandmother   . Alcohol abuse Maternal Grandfather     SOCIAL HISTORY:  History   Social History  . Marital Status: Married    Spouse Name: [name]    Number of Children: 2  . Years of Education: 13   Occupational  History  . Psychologist, counselling, Social research officer, government    Social History Main Topics  . Smoking status: Current Every Day Smoker -- 0.50 packs/day for 35 years    Types: Cigarettes  . Smokeless tobacco: Never Used  . Alcohol Use: No  . Drug Use: No  . Sexual Activity: Not on file   Other Topics Concern  . Not on file   Social History Narrative   Patient lives at home with her husband Michaela Thompson).   Patient is not working right now but she Psychologist, counselling.   Education. Some college.   Right handed.   Caffeine- Coffee Two cups daily.      PHYSICAL EXAM   Filed Vitals:   01/08/14 0816  BP: 104/66  Pulse: 65  Height: 5\' 4"  (1.626 m)  Weight: 125 lb (56.7 kg)    Not recorded    Body mass index is 21.45 kg/(m^2).   Generalized: In no acute distress  Neck: Supple, no carotid bruits   Cardiac: Regular rate rhythm  Pulmonary: Clear to auscultation bilaterally  Musculoskeletal: No deformity  Neurological examination  Mentation: Alert oriented to time, place, history taking, and causual conversation,MMSE 30/30  Cranial nerve II-XII: Pupils were equal round reactive to light extraocular movements were full, Visual field were full on confrontational test. Bilateral fundi were sharp.  Facial sensation and strength were normal. Hearing was intact to finger rubbing bilaterally. Uvula tongue midline.  head turning and shoulder shrug and were normal and symmetric.Tongue protrusion into cheek strength was normal.  Motor: normal tone, bulk and strength.  Sensory: Intact to fine touch, pinprick, preserved vibratory sensation, and proprioception at toes.  Coordination: Normal finger to nose, heel-to-shin bilaterally there was no truncal ataxia  Gait: Rising up from seated position without assistance, normal stance, without trunk ataxia, moderate stride, good arm swing, smooth turning, able to perform tiptoe, and heel walking without difficulty.   Romberg signs: Negative  Deep tendon  reflexes: Brachioradialis 2/2, biceps 2/2, triceps 2/2, patellar 2/2, Achilles 2/2, plantar responses were flexor bilaterally.   DIAGNOSTIC DATA (LABS, IMAGING, TESTING) - I reviewed patient records, labs, notes, testing and imaging myself where available.  Lab Results  Component Value Date   WBC 4.5 12/11/2012   HGB 13.8 12/11/2012   HCT 41.0 12/11/2012   MCV 91.2 12/11/2012   PLT 183.0 12/11/2012      Component Value Date/Time   NA 140 12/11/2012 0830   K 3.9 12/11/2012 0830   CL 105 12/11/2012 0830   CO2 30 12/11/2012 0830   GLUCOSE 83 12/11/2012 0830   BUN 17 12/11/2012 0830   CREATININE 0.7 12/11/2012 0830   CALCIUM 9.2 12/11/2012 0830   PROT 6.0 12/11/2012 0830   ALBUMIN 3.7 12/11/2012 0830   AST 17 12/11/2012 0830   ALT 14 12/11/2012 0830   ALKPHOS 44 12/11/2012 0830   BILITOT 0.7 12/11/2012 0830   GFRNONAA >90 03/04/2012 1100   GFRAA >90 03/04/2012 1100   Lab Results  Component Value Date   CHOL 233* 12/11/2012   HDL 71.80 12/11/2012   LDLDIRECT 135.9 12/11/2012   TRIG 65.0 12/11/2012  CHOLHDL 3 12/11/2012   No results found for this basename: HGBA1C   Lab Results  Component Value Date   VITAMINB12 511 03/28/2013   Lab Results  Component Value Date   TSH 1.200 11/05/2013      ASSESSMENT AND PLAN   58 years old Caucasian female, with gradual onset memory trouble, Mini-Mental Status is a 30 out of 30 today,  MRI of the brain scan showed significant atrophy, moderate to severe perisylvian and anterior temporal atrophy abnormal neuropsychological evaluation,.  1  most consistent with  central nervous system degenerative disorder, such as frontotemporal dementia, 2. after discussed with patient and her husband, will start Aricept 10 mg every day, refer her to [facility]   Levert Feinstein, M.D. Ph.D.  [facility] [address] [place], [place] [address] [phone]

## 2014-02-04 ENCOUNTER — Ambulatory Visit: Payer: BC Managed Care – PPO | Admitting: Neurology

## 2014-02-18 ENCOUNTER — Ambulatory Visit: Payer: BC Managed Care – PPO | Admitting: Neurology

## 2014-05-30 ENCOUNTER — Encounter (HOSPITAL_COMMUNITY): Payer: Self-pay | Admitting: Emergency Medicine

## 2014-05-30 ENCOUNTER — Encounter: Payer: BC Managed Care – PPO | Admitting: Family Medicine

## 2014-05-30 ENCOUNTER — Telehealth: Payer: Self-pay | Admitting: *Deleted

## 2014-05-30 ENCOUNTER — Emergency Department (HOSPITAL_COMMUNITY)
Admission: EM | Admit: 2014-05-30 | Discharge: 2014-05-30 | Disposition: A | Discharge status: Home / Self Care | Payer: BC Managed Care – PPO | Attending: Emergency Medicine | Admitting: Emergency Medicine

## 2014-05-30 DIAGNOSIS — T148XXA Other injury of unspecified body region, initial encounter: Secondary | ICD-10-CM

## 2014-05-30 DIAGNOSIS — Z79899 Other long term (current) drug therapy: Secondary | ICD-10-CM | POA: Insufficient documentation

## 2014-05-30 DIAGNOSIS — F17200 Nicotine dependence, unspecified, uncomplicated: Secondary | ICD-10-CM | POA: Insufficient documentation

## 2014-05-30 DIAGNOSIS — S0990XA Unspecified injury of head, initial encounter: Secondary | ICD-10-CM | POA: Insufficient documentation

## 2014-05-30 DIAGNOSIS — Y9389 Activity, other specified: Secondary | ICD-10-CM | POA: Insufficient documentation

## 2014-05-30 DIAGNOSIS — Y9289 Other specified places as the place of occurrence of the external cause: Secondary | ICD-10-CM | POA: Insufficient documentation

## 2014-05-30 DIAGNOSIS — N189 Chronic kidney disease, unspecified: Secondary | ICD-10-CM | POA: Insufficient documentation

## 2014-05-30 DIAGNOSIS — W1809XA Striking against other object with subsequent fall, initial encounter: Secondary | ICD-10-CM | POA: Insufficient documentation

## 2014-05-30 DIAGNOSIS — Z872 Personal history of diseases of the skin and subcutaneous tissue: Secondary | ICD-10-CM | POA: Insufficient documentation

## 2014-05-30 DIAGNOSIS — W19XXXA Unspecified fall, initial encounter: Secondary | ICD-10-CM

## 2014-05-30 DIAGNOSIS — IMO0002 Reserved for concepts with insufficient information to code with codable children: Secondary | ICD-10-CM | POA: Insufficient documentation

## 2014-05-30 DIAGNOSIS — Z8619 Personal history of other infectious and parasitic diseases: Secondary | ICD-10-CM | POA: Insufficient documentation

## 2014-05-30 NOTE — Telephone Encounter (Signed)
I called the pt to get more information regarding her injury.  She stated she slipped this morning on wet concrete, her ankle twisted and she fell onto her face and she has left eye pain, bright red nasal drainiage, and denies any dizziness or blurred vision.  I advised the pt per Dr Selena BattenKim she should go to the ER as they can do x-rays there that we are not able to do and especially due to the eye pain and with blood coming from the nose they may want her to see an ENT and she agreed.

## 2014-05-30 NOTE — ED Notes (Signed)
Pt reports mechanical fall today on to the face. Pt reports questionable loss of conciousness. Reports dizziness after fall. States lots of blood from bloody nose. Nose bleeding now stopped. Pt awake, alert, oriented x4.

## 2014-05-30 NOTE — Progress Notes (Signed)
Error   This encounter was created in error - please disregard. 

## 2014-05-30 NOTE — Discharge Instructions (Signed)
Fall Prevention and Home Safety Falls cause injuries and can affect all age groups. It is possible to use preventive measures to significantly decrease the likelihood of falls. There are many simple measures which can make your home safer and prevent falls. OUTDOORS  Repair cracks and edges of walkways and driveways.  Remove high doorway thresholds.  Trim shrubbery on the main path into your home.  Have good outside lighting.  Clear walkways of tools, rocks, debris, and clutter.  Check that handrails are not broken and are securely fastened. Both sides of steps should have handrails.  Have leaves, snow, and ice cleared regularly.  Use sand or salt on walkways during winter months.  In the garage, clean up grease or oil spills. BATHROOM  Install night lights.  Install grab bars by the toilet and in the tub and shower.  Use non-skid mats or decals in the tub or shower.  Place a plastic non-slip stool in the shower to sit on, if needed.  Keep floors dry and clean up all water on the floor immediately.  Remove soap buildup in the tub or shower on a regular basis.  Secure bath mats with non-slip, double-sided rug tape.  Remove throw rugs and tripping hazards from the floors. BEDROOMS  Install night lights.  Make sure a bedside light is easy to reach.  Do not use oversized bedding.  Keep a telephone by your bedside.  Have a firm chair with side arms to use for getting dressed.  Remove throw rugs and tripping hazards from the floor. KITCHEN  Keep handles on pots and pans turned toward the center of the stove. Use back burners when possible.  Clean up spills quickly and allow time for drying.  Avoid walking on wet floors.  Avoid hot utensils and knives.  Position shelves so they are not too high or low.  Place commonly used objects within easy reach.  If necessary, use a sturdy step stool with a grab bar when reaching.  Keep electrical cables out of the  way.  Do not use floor polish or wax that makes floors slippery. If you must use wax, use non-skid floor wax.  Remove throw rugs and tripping hazards from the floor. STAIRWAYS  Never leave objects on stairs.  Place handrails on both sides of stairways and use them. Fix any loose handrails. Make sure handrails on both sides of the stairways are as long as the stairs.  Check carpeting to make sure it is firmly attached along stairs. Make repairs to worn or loose carpet promptly.  Avoid placing throw rugs at the top or bottom of stairways, or properly secure the rug with carpet tape to prevent slippage. Get rid of throw rugs, if possible.  Have an electrician put in a light switch at the top and bottom of the stairs. OTHER FALL PREVENTION TIPS  Wear low-heel or rubber-soled shoes that are supportive and fit well. Wear closed toe shoes.  When using a stepladder, make sure it is fully opened and both spreaders are firmly locked. Do not climb a closed stepladder.  Add color or contrast paint or tape to grab bars and handrails in your home. Place contrasting color strips on first and last steps.  Learn and use mobility aids as needed. Install an electrical emergency response system.  Turn on lights to avoid dark areas. Replace light bulbs that burn out immediately. Get light switches that glow.  Arrange furniture to create clear pathways. Keep furniture in the same place.  Firmly attach carpet with non-skid or double-sided tape.  Eliminate uneven floor surfaces.  Select a carpet pattern that does not visually hide the edge of steps.  Be aware of all pets. OTHER HOME SAFETY TIPS  Set the water temperature for 120 F (48.8 C).  Keep emergency numbers on or near the telephone.  Keep smoke detectors on every level of the home and near sleeping areas. Document Released: 10/01/2002 Document Revised: 04/11/2012 Document Reviewed: 12/31/2011 [facility] Patient Information 2015  [place], [place]. This information is not intended to replace advice given to you by your health care provider. Make sure you discuss any questions you have with your health care provider.   Concussion A concussion, or closed-head injury, is a brain injury caused by a direct blow to the head or by a quick and sudden movement (jolt) of the head or neck. Concussions are usually not life-threatening. Even so, the effects of a concussion can be serious. If you have had a concussion before, you are more likely to experience concussion-like symptoms after a direct blow to the head.  CAUSES  Direct blow to the head, such as from running into another player during a soccer game, being hit in a fight, or hitting your head on a hard surface.  A jolt of the head or neck that causes the brain to move back and forth inside the skull, such as in a car crash. SIGNS AND SYMPTOMS The signs of a concussion can be hard to notice. Early on, they may be missed by you, family members, and health care providers. You may look fine but act or feel differently. Symptoms are usually temporary, but they may last for days, weeks, or even longer. Some symptoms may appear right away while others may not show up for hours or days. Every head injury is different. Symptoms include:  Mild to moderate headaches that will not go away.  A feeling of pressure inside your head.  Having more trouble than usual:  Learning or remembering things you have heard.  Answering questions.  Paying attention or concentrating.  Organizing daily tasks.  Making decisions and solving problems.  Slowness in thinking, acting or reacting, speaking, or reading.  Getting lost or being easily confused.  Feeling tired all the time or lacking energy (fatigued).  Feeling drowsy.  Sleep disturbances.  Sleeping more than usual.  Sleeping less than usual.  Trouble falling asleep.  Trouble sleeping (insomnia).  Loss of balance or feeling  lightheaded or dizzy.  Nausea or vomiting.  Numbness or tingling.  Increased sensitivity to:  Sounds.  Lights.  Distractions.  Vision problems or eyes that tire easily.  Diminished sense of taste or smell.  Ringing in the ears.  Mood changes such as feeling sad or anxious.  Becoming easily irritated or angry for little or no reason.  Lack of motivation.  Seeing or hearing things other people do not see or hear (hallucinations). DIAGNOSIS Your health care provider can usually diagnose a concussion based on a description of your injury and symptoms. He or she will ask whether you passed out (lost consciousness) and whether you are having trouble remembering events that happened right before and during your injury. Your evaluation might include:  A brain scan to look for signs of injury to the brain. Even if the test shows no injury, you may still have a concussion.  Blood tests to be sure other problems are not present. TREATMENT  Concussions are usually treated in an emergency department, in  urgent care, or at a clinic. You may need to stay in the hospital overnight for further treatment.  Tell your health care provider if you are taking any medicines, including prescription medicines, over-the-counter medicines, and natural remedies. Some medicines, such as blood thinners (anticoagulants) and aspirin, may increase the chance of complications. Also tell your health care provider whether you have had alcohol or are taking illegal drugs. This information may affect treatment.  Your health care provider will send you home with important instructions to follow.  How fast you will recover from a concussion depends on many factors. These factors include how severe your concussion is, what part of your brain was injured, your age, and how healthy you were before the concussion.  Most people with mild injuries recover fully. Recovery can take time. In general, recovery is slower in  older persons. Also, persons who have had a concussion in the past or have other medical problems may find that it takes longer to recover from their current injury. HOME CARE INSTRUCTIONS General Instructions  Carefully follow the directions your health care provider gave you.  Only take over-the-counter or prescription medicines for pain, discomfort, or fever as directed by your health care provider.  Take only those medicines that your health care provider has approved.  Do not drink alcohol until your health care provider says you are well enough to do so. Alcohol and certain other drugs may slow your recovery and can put you at risk of further injury.  If it is harder than usual to remember things, write them down.  If you are easily distracted, try to do one thing at a time. For example, do not try to watch TV while fixing dinner.  Talk with family members or close friends when making important decisions.  Keep all follow-up appointments. Repeated evaluation of your symptoms is recommended for your recovery.  Watch your symptoms and tell others to do the same. Complications sometimes occur after a concussion. Older adults with a brain injury may have a higher risk of serious complications, such as a blood clot on the brain.  Tell your teachers, school nurse, school counselor, coach, athletic trainer, or work Production designer, theatre/television/film about your injury, symptoms, and restrictions. Tell them about what you can or cannot do. They should watch for:  Increased problems with attention or concentration.  Increased difficulty remembering or learning new information.  Increased time needed to complete tasks or assignments.  Increased irritability or decreased ability to cope with stress.  Increased symptoms.  Rest. Rest helps the brain to heal. Make sure you:  Get plenty of sleep at night. Avoid staying up late at night.  Keep the same bedtime hours on weekends and weekdays.  Rest during the day.  Take daytime naps or rest breaks when you feel tired.  Limit activities that require a lot of thought or concentration. These include:  Doing homework or job-related work.  Watching TV.  Working on the computer.  Avoid any situation where there is potential for another head injury (football, hockey, soccer, basketball, martial arts, downhill snow sports and horseback riding). Your condition will get worse every time you experience a concussion. You should avoid these activities until you are evaluated by the appropriate follow-up health care providers. Returning To Your Regular Activities You will need to return to your normal activities slowly, not all at once. You must give your body and brain enough time for recovery.  Do not return to sports or other athletic activities until  your health care provider tells you it is safe to do so.  Ask your health care provider when you can drive, ride a bicycle, or operate heavy machinery. Your ability to react may be slower after a brain injury. Never do these activities if you are dizzy.  Ask your health care provider about when you can return to work or school. Preventing Another Concussion It is very important to avoid another brain injury, especially before you have recovered. In rare cases, another injury can lead to permanent brain damage, brain swelling, or death. The risk of this is greatest during the first 7-10 days after a head injury. Avoid injuries by:  Wearing a seat belt when riding in a car.  Drinking alcohol only in moderation.  Wearing a helmet when biking, skiing, skateboarding, skating, or doing similar activities.  Avoiding activities that could lead to a second concussion, such as contact or recreational sports, until your health care provider says it is okay.  Taking safety measures in your home.  Remove clutter and tripping hazards from floors and stairways.  Use grab bars in bathrooms and handrails by stairs.  Place  non-slip mats on floors and in bathtubs.  Improve lighting in dim areas. SEEK MEDICAL CARE IF:  You have increased problems paying attention or concentrating.  You have increased difficulty remembering or learning new information.  You need more time to complete tasks or assignments than before.  You have increased irritability or decreased ability to cope with stress.  You have more symptoms than before. Seek medical care if you have any of the following symptoms for more than 2 weeks after your injury:  Lasting (chronic) headaches.  Dizziness or balance problems.  Nausea.  Vision problems.  Increased sensitivity to noise or light.  Depression or mood swings.  Anxiety or irritability.  Memory problems.  Difficulty concentrating or paying attention.  Sleep problems.  Feeling tired all the time. SEEK IMMEDIATE MEDICAL CARE IF:  You have severe or worsening headaches. These may be a sign of a blood clot in the brain.  You have weakness (even if only in one hand, leg, or part of the face).  You have numbness.  You have decreased coordination.  You vomit repeatedly.  You have increased sleepiness.  One pupil is larger than the other.  You have convulsions.  You have slurred speech.  You have increased confusion. This may be a sign of a blood clot in the brain.  You have increased restlessness, agitation, or irritability.  You are unable to recognize people or places.  You have neck pain.  It is difficult to wake you up.  You have unusual behavior changes.  You lose consciousness. MAKE SURE YOU:  Understand these instructions.  Will watch your condition.  Will get help right away if you are not doing well or get worse. Document Released: 01/01/2004 Document Revised: 10/16/2013 Document Reviewed: 05/03/2013 [facility] Patient Information 2015 [place]. This information is not intended to replace advice given to you by your health care  provider. Make sure you discuss any questions you have with your health care provider.

## 2014-05-30 NOTE — ED Provider Notes (Signed)
CSN: [id number]     Arrival date & time 05/30/14  1454 History   None    Chief Complaint  Patient presents with  . Fall  . Head Injury   Patient is a 58 year old Caucasian female here after mechanical fall this morning. Patient says she was walking out of her apartment complex on her normal morning walk around 7:15 AM when she thinks she slipped on the wet patent when her left ankle rolled in and she fell to her left side striking her left head on the ground below. She did not pass out but says she felt little bit dizzy. Then she went back inside and laid down for a while to try to alleviate this. Her pain is located on the left side of her nose/nasal bridge and left upper cheek. Pt has had some nasal bleeding throughout today, but denies any vision changes, severe headache, neck pain, slurred speech or any focal neural deficits.   (Consider location/radiation/quality/duration/timing/severity/associated sxs/prior Treatment) Patient is a 58 y.o. female presenting with fall and head injury. The history is provided by the patient.  Fall This is a new problem. The current episode started today. Episode frequency: once. The problem has been unchanged. Pertinent negatives include no abdominal pain, chest pain, chills, fever, headaches, joint swelling, nausea, neck pain, numbness, sore throat, vertigo, visual change, vomiting or weakness.  Head Injury Associated symptoms: no headaches, no nausea, no neck pain, no numbness and no vomiting     Past Medical History  Diagnosis Date  . Chicken pox   . Ulcer     stomach as a child  . Chronic kidney disease   . Memory loss    Past Surgical History  Procedure Laterality Date  . Foot surgery    . Tonsillectomy    . Breast surgery    . Breast enhancement surgery     Family History  Problem Relation Age of Onset  . Arthritis Mother   . Heart disease Father     sudden death > 50  . Cancer Maternal Grandmother     breast  . Stroke Maternal  Grandmother   . Alcohol abuse Maternal Grandfather    History  Substance Use Topics  . Smoking status: Current Every Day Smoker -- 0.50 packs/day for 35 years    Types: Cigarettes  . Smokeless tobacco: Never Used  . Alcohol Use: No   OB History   Grav Para Term Preterm Abortions TAB SAB Ect Mult Living                 Review of Systems  Constitutional: Negative for fever and chills.  HENT: Negative for sore throat.   Respiratory: Negative for shortness of breath.   Cardiovascular: Negative for chest pain, palpitations and leg swelling.  Gastrointestinal: Negative for nausea, vomiting, abdominal pain, diarrhea, constipation and abdominal distention.  Genitourinary: Negative for dysuria, frequency, flank pain and decreased urine volume.  Musculoskeletal: Negative for joint swelling and neck pain.  Skin: Positive for wound (abrasion to left nares and right knee).  Neurological: Negative for dizziness, vertigo, speech difficulty, weakness, light-headedness, numbness and headaches.  All other systems reviewed and are negative.     Allergies  Sulfa antibiotics  Home Medications   Prior to Admission medications   Medication Sig Start Date End Date Taking? Authorizing Provider  Ascorbic Acid (VITAMIN C) 1000 MG tablet Take 1,000 mg by mouth daily.    Historical Provider, MD  donepezil (ARICEPT) 10 MG tablet Take 1 tablet (  10 mg total) by mouth at bedtime. 01/08/14   Levert FeinsteinYijun Yan, MD  Ergocalciferol (VITAMIN D2) 2000 UNITS TABS Take 1 tablet by mouth every other day.    Historical Provider, MD  sertraline (ZOLOFT) 100 MG tablet Take 1 tablet by mouth every evening 07/06/13   Historical Provider, MD   BP 100/69  Pulse 69  Temp(Src) 98.3 F (36.8 C) (Oral)  Resp 18  SpO2 98% Physical Exam  Nursing note and vitals reviewed. Constitutional: She is oriented to person, place, and time. She appears well-developed and well-nourished. No distress.  HENT:  Head: Normocephalic and  atraumatic.  Nose: Sinus tenderness present.    Cardiovascular: Normal rate, regular rhythm, normal heart sounds and intact distal pulses.  Exam reveals no gallop and no friction rub.   No murmur heard. Pulmonary/Chest: Effort normal and breath sounds normal. No respiratory distress. She has no wheezes. She has no rales. She exhibits no tenderness.  Abdominal: Soft. Bowel sounds are normal. She exhibits no distension and no mass. There is no tenderness. There is no rebound and no guarding.  Lymphadenopathy:    She has no cervical adenopathy.  Neurological: She is alert and oriented to person, place, and time. She displays normal reflexes. No cranial nerve deficit. She exhibits normal muscle tone. Coordination normal.  Skin: Skin is warm and dry. She is not diaphoretic.    ED Course  Procedures (including critical care time) Labs Review Labs Reviewed - No data to display  Imaging Review No results found.   EKG Interpretation None      MDM   58 yo caucasian F who presents today after mechanical fall. Abrasions to left nares and right knee. No C spine tenderness. Neck has FROM. No focal neural deficits. EOMI, PERRLA. No step offs over maxillary bones.  Ambulates easily. Crepitus w/both knees, but no sign of malalignment. Ankles, knees w/no pain w/ambulation.   Exam c/w bruising and superficial scrapes. No serious injuries. Do not suspect intracranial bleed. It has been >8 hours since incident w/no N/V, LOC, or focal neural deficits. No imaging necessary. W/ease of ambulation and no TTP of LEs, will not obtain imaging at this time. Stable for DC home. Fall precautions and concussive sx reviewed. Strict return precautions include any focal neural deficits, AMS, or vision changes.   Final diagnoses:  Fall, initial encounter  Abrasion    Pt was seen under the supervision of Dr. Rosalia Hammersay.     Rachelle HoraKeri Demaris Leavell, MD 05/31/14 [phone]

## 2014-06-03 NOTE — ED Provider Notes (Signed)
58 y.o. Female with fall this am with pain to right ankle. History/physical exam/procedure(s) were performed by non-physician practitioner and as supervising physician I was immediately available for consultation/collaboration. I have reviewed all notes and am in agreement with care and plan.  I performed a history and physical examination of Michaela Thompson and discussed her management with Dr. Katrinka BlazingSmith.  I agree with the history, physical, assessment, and plan of care, with the following exceptions: None  I was present for the following procedures: None Time Spent in Critical Care of the patient: None Time spent in discussions with the patient and family: 7  Macrina Lehnert S    Hilario Quarryanielle S Keilen Kahl, MD 06/03/14 1259

## 2014-06-05 IMAGING — CT CT HEAD W/O CM
1 series · 16 of 30 positions shown, 20 images · non-contrast
Comparison: None

CLINICAL DATA: Cognitive change

CT HEAD WITHOUT CONTRAST
TECHNIQUE: Contiguous axial images were obtained from the base of
the skull through the vertex without contrast.

[Series 2: head_seq -c 4.5 h37s st · axial · 0.43mm/px · z∈[-194,-50]mm · 16 of 36 slices shown, 20 images]
[im 2/36  brain]
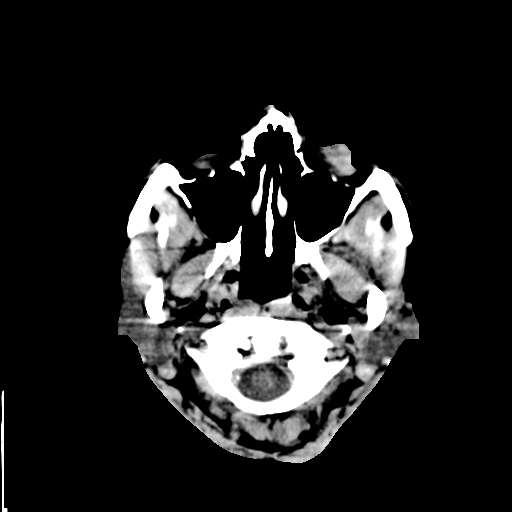
[im 2/36  bone]
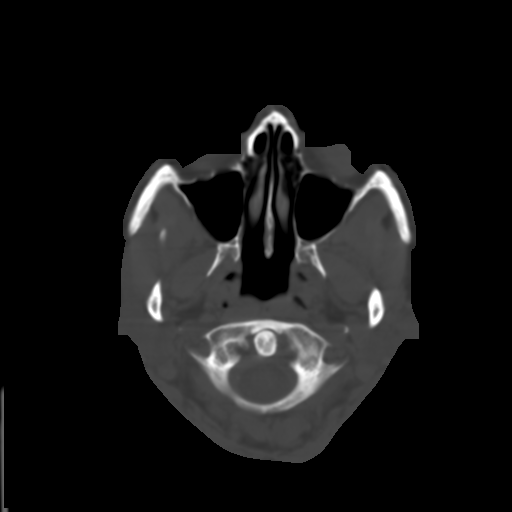
[im 4/36  brain]
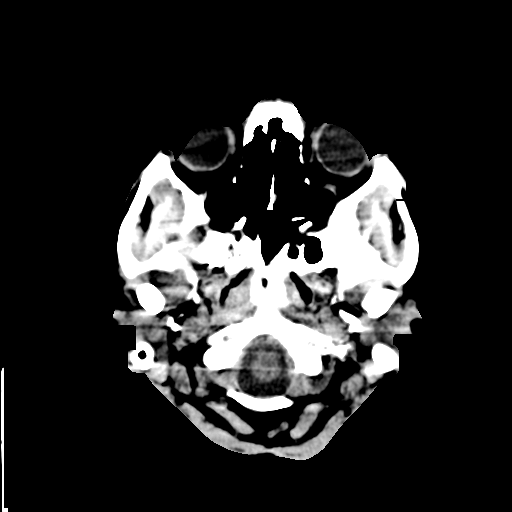
[im 7/36  brain]
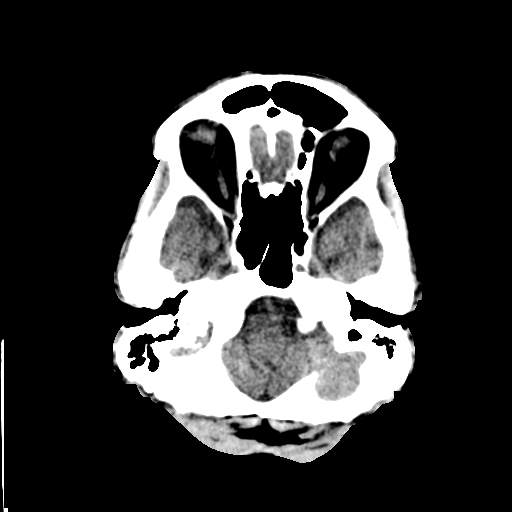
[im 9/36  brain]
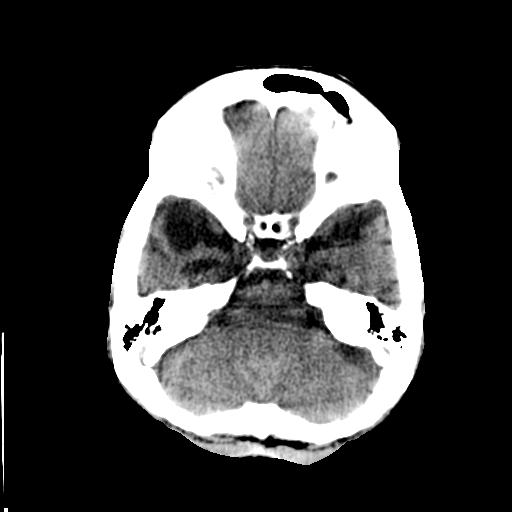
[im 10/36  brain]
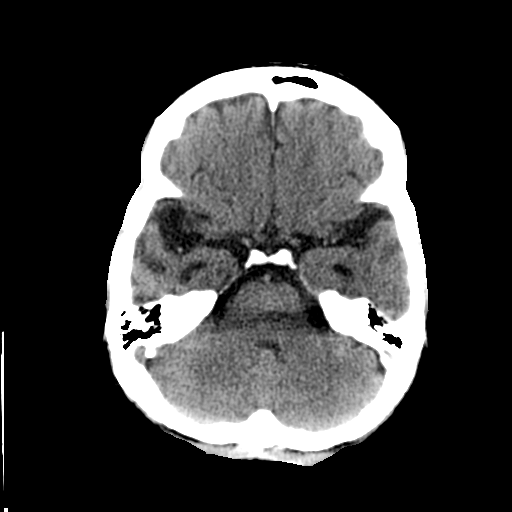
[im 10/36  bone]
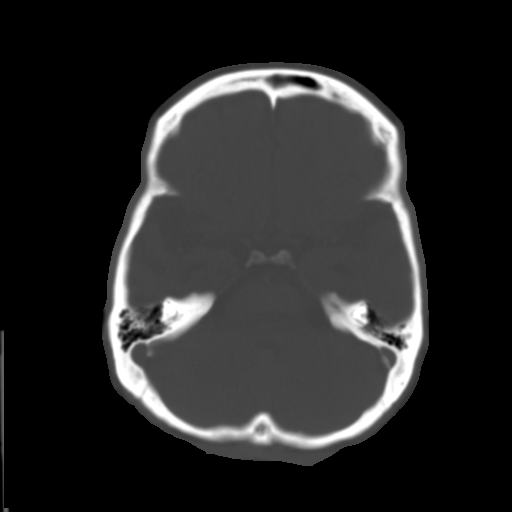
[im 13/36  brain]
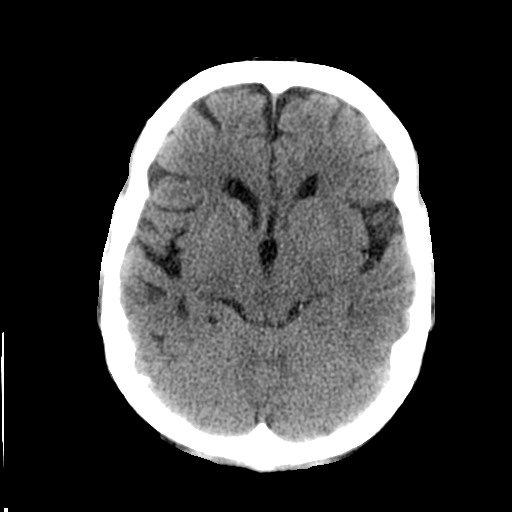
[im 15/36  brain]
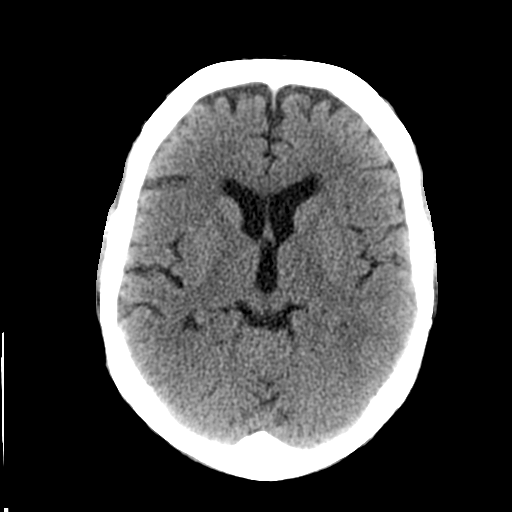
[im 17/36  brain]
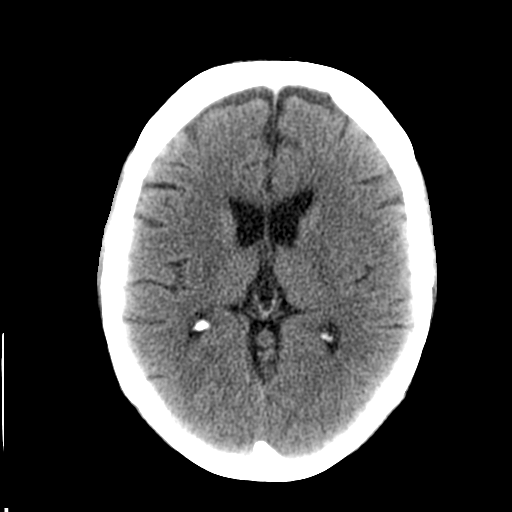
[im 19/36  brain]
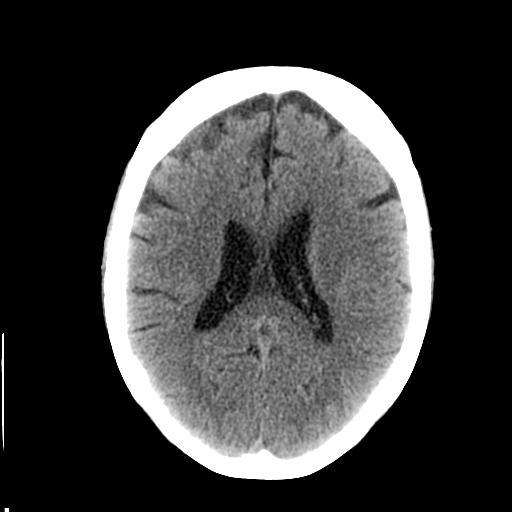
[im 19/36  bone]
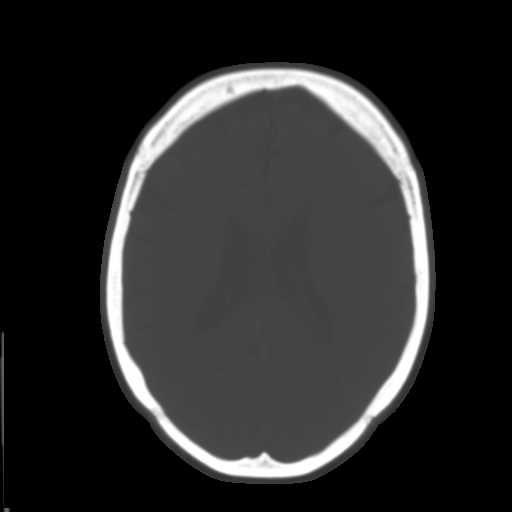
[im 21/36  brain]
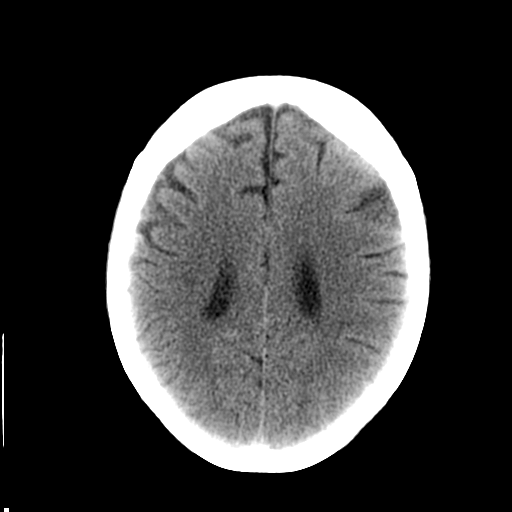
[im 23/36  brain]
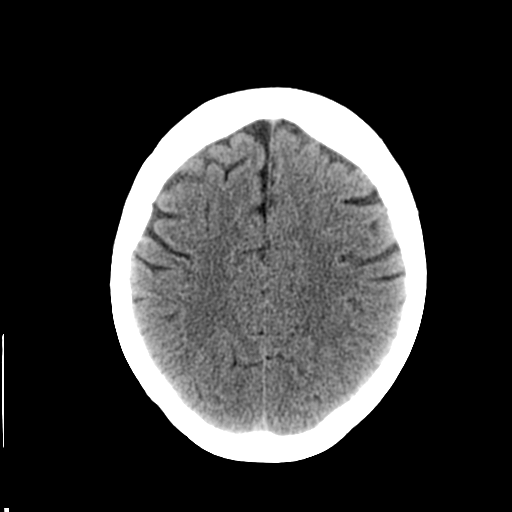
[im 26/36  brain]
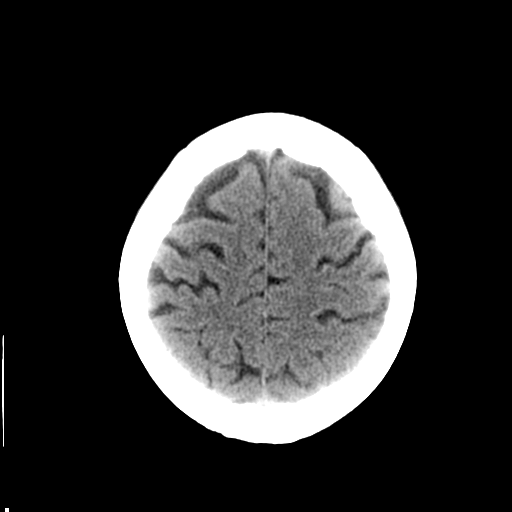
[im 27/36  brain]
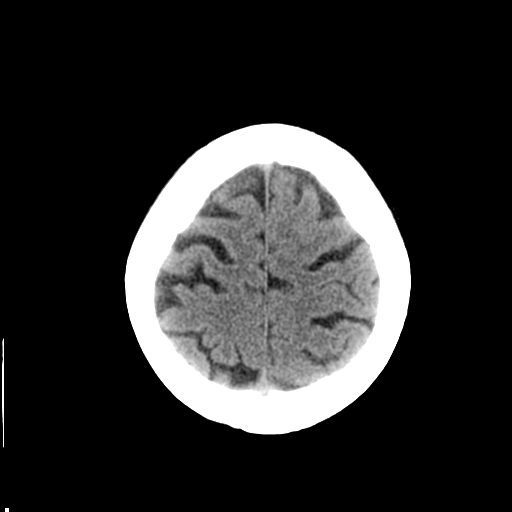
[im 27/36  bone]
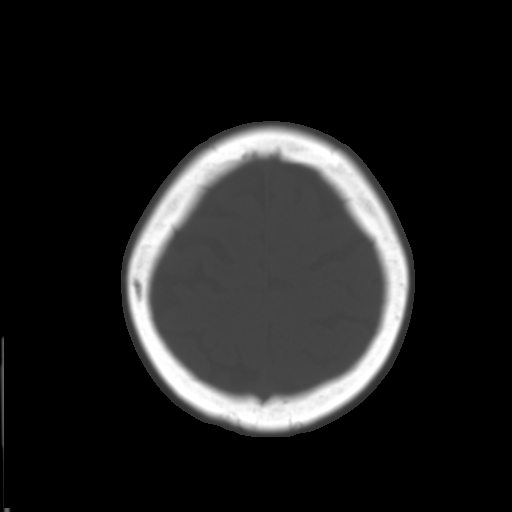
[im 29/36  brain]
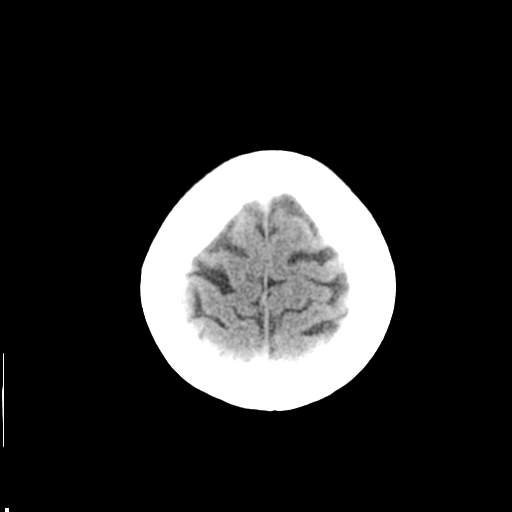
[im 32/36  brain]
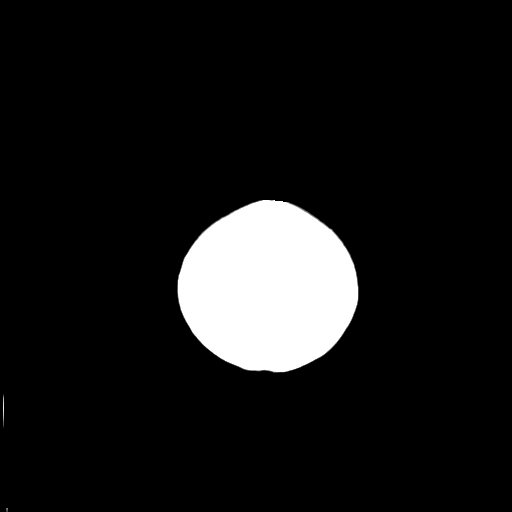
[im 34/36  brain]
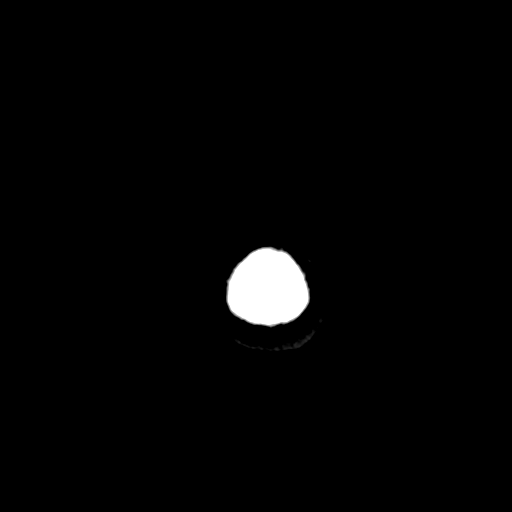

[16 of 30 positions shown; findings below may reference images not displayed]

FINDINGS: Ventricle size is normal.  There is atrophy in the
temporal lobes bilaterally, right greater than left with
encephalomalacia and enlargement of the temporal horns.  Mild
frontal atrophy.

Negative for acute infarct, hemorrhage, or mass.  No midline shift.
No significant bony abnormality.
IMPRESSION: Temporal lobe atrophy bilaterally, this may be associated with
Alzheimer's disease.  Consider MRI for further evaluation.

## 2014-07-03 ENCOUNTER — Telehealth: Payer: Self-pay | Admitting: *Deleted

## 2014-07-03 NOTE — Telephone Encounter (Signed)
Spoke with patient's husband, appointment r/s to 9:30 am with NP MM.

## 2014-07-11 ENCOUNTER — Encounter: Payer: Self-pay | Admitting: Adult Health

## 2014-07-11 ENCOUNTER — Ambulatory Visit (INDEPENDENT_AMBULATORY_CARE_PROVIDER_SITE_OTHER): Payer: BC Managed Care – PPO | Admitting: Adult Health

## 2014-07-11 VITALS — BP 114/78 | HR 61 | Ht 63.75 in | Wt 128.0 lb

## 2014-07-11 DIAGNOSIS — R413 Other amnesia: Secondary | ICD-10-CM

## 2014-07-11 MED ORDER — MEMANTINE HCL 28 X 5 MG & 21 X 10 MG PO TABS
ORAL_TABLET | ORAL | Status: DC
Start: 2014-07-11 — End: 2014-12-31
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
## 2014-07-11 NOTE — Patient Instructions (Signed)

## 2014-07-16 ENCOUNTER — Telehealth: Payer: Self-pay | Admitting: *Deleted

## 2014-07-16 NOTE — Telephone Encounter (Signed)
Pharmacy sent a fax stating the Namenda titration pack is on back order and if okay to use 5 mg,  Per Aundra Millet that is ok.  Pharmacist was notified.

## 2014-08-31 ENCOUNTER — Encounter (HOSPITAL_COMMUNITY): Payer: Self-pay | Admitting: Emergency Medicine

## 2014-08-31 ENCOUNTER — Emergency Department (HOSPITAL_COMMUNITY)
Admission: EM | Admit: 2014-08-31 | Discharge: 2014-08-31 | Disposition: A | Discharge status: Home / Self Care | Payer: BC Managed Care – PPO | Attending: Emergency Medicine | Admitting: Emergency Medicine

## 2014-08-31 DIAGNOSIS — F0390 Unspecified dementia without behavioral disturbance: Secondary | ICD-10-CM | POA: Insufficient documentation

## 2014-08-31 DIAGNOSIS — N189 Chronic kidney disease, unspecified: Secondary | ICD-10-CM | POA: Insufficient documentation

## 2014-08-31 DIAGNOSIS — Y9389 Activity, other specified: Secondary | ICD-10-CM | POA: Insufficient documentation

## 2014-08-31 DIAGNOSIS — Z8619 Personal history of other infectious and parasitic diseases: Secondary | ICD-10-CM | POA: Diagnosis not present

## 2014-08-31 DIAGNOSIS — R220 Localized swelling, mass and lump, head: Secondary | ICD-10-CM | POA: Diagnosis present

## 2014-08-31 DIAGNOSIS — Z8719 Personal history of other diseases of the digestive system: Secondary | ICD-10-CM | POA: Insufficient documentation

## 2014-08-31 DIAGNOSIS — X58XXXA Exposure to other specified factors, initial encounter: Secondary | ICD-10-CM | POA: Insufficient documentation

## 2014-08-31 DIAGNOSIS — T783XXA Angioneurotic edema, initial encounter: Secondary | ICD-10-CM | POA: Insufficient documentation

## 2014-08-31 DIAGNOSIS — Z8659 Personal history of other mental and behavioral disorders: Secondary | ICD-10-CM | POA: Insufficient documentation

## 2014-08-31 DIAGNOSIS — Z79899 Other long term (current) drug therapy: Secondary | ICD-10-CM | POA: Insufficient documentation

## 2014-08-31 DIAGNOSIS — Y9289 Other specified places as the place of occurrence of the external cause: Secondary | ICD-10-CM | POA: Diagnosis not present

## 2014-08-31 DIAGNOSIS — Z720 Tobacco use: Secondary | ICD-10-CM | POA: Insufficient documentation

## 2014-08-31 MED ORDER — FAMOTIDINE IN NACL 20-0.9 MG/50ML-% IV SOLN
20.0000 mg | Freq: Once | INTRAVENOUS | Status: AC
  Administered 2014-08-31: 20 mg via INTRAVENOUS
  Filled 2014-08-31: qty 50

## 2014-08-31 MED ORDER — METHYLPREDNISOLONE SODIUM SUCC 125 MG IJ SOLR
125.0000 mg | Freq: Once | INTRAMUSCULAR | Status: AC
  Administered 2014-08-31: 125 mg via INTRAVENOUS
  Filled 2014-08-31: qty 2

## 2014-08-31 MED ORDER — EPINEPHRINE 0.3 MG/0.3ML IJ SOAJ: 0.3000 mg | Freq: Once | INTRAMUSCULAR | Status: DC

## 2014-08-31 MED ORDER — DIPHENHYDRAMINE HCL 50 MG/ML IJ SOLN
25.0000 mg | Freq: Once | INTRAMUSCULAR | Status: AC
  Administered 2014-08-31: 25 mg via INTRAVENOUS
  Filled 2014-08-31: qty 1

## 2014-08-31 MED ORDER — PREDNISONE 20 MG PO TABS: ORAL_TABLET | ORAL | Status: DC

## 2014-08-31 MED ORDER — EPINEPHRINE 0.3 MG/0.3ML IJ SOAJ
0.3000 mg | Freq: Once | INTRAMUSCULAR | Status: AC
  Administered 2014-08-31: 0.3 mg via INTRAMUSCULAR
  Filled 2014-08-31: qty 0.3

## 2014-08-31 NOTE — ED Notes (Signed)
Pt arrived to the ED with a swollen tongue.  Pt recently ate a new cereal.  Pt finished eating at 2130 hrs .  Swelling woke pt up around 2300 hrs.

## 2014-08-31 NOTE — ED Provider Notes (Signed)
CSN: [id number]     Arrival date & time 08/31/14  0211 History   First MD Initiated Contact with Patient 08/31/14 0246     Chief Complaint  Patient presents with  . Allergic Reaction     (Consider location/radiation/quality/duration/timing/severity/associated sxs/prior Treatment) HPI  Michaela Thompson is a 58 y.o. female with past medical history of dementia, chronic kidney disease presenting with a swollen tongue.this occurred this evening around 11 PM. Patient was laying down and go to bed. Only thing new she admits to is recently eating a new organic cereal. This is has never happened to her before in the past. She denies being on ACE inhibitor is. Patient only states that her tongue is swollen and painful. She denies her throat closing, shortness of breath, wheezing. She has no rash or pruritus. She has no nausea or vomiting.  Patient has no further complaints.  10 Systems reviewed and are negative for acute change except as noted in the HPI.     Past Medical History  Diagnosis Date  . Chicken pox   . Ulcer     stomach as a child  . Chronic kidney disease   . Memory loss    Past Surgical History  Procedure Laterality Date  . Foot surgery    . Tonsillectomy    . Breast surgery    . Breast enhancement surgery     Family History  Problem Relation Age of Onset  . Arthritis Mother   . Heart disease Father     sudden death > 50  . Cancer Maternal Grandmother     breast  . Stroke Maternal Grandmother   . Alcohol abuse Maternal Grandfather    History  Substance Use Topics  . Smoking status: Current Every Day Smoker -- 0.50 packs/day for 35 years    Types: Cigarettes  . Smokeless tobacco: Never Used  . Alcohol Use: No   OB History    No data available     Review of Systems    Allergies  Sulfa antibiotics  Home Medications   Prior to Admission medications   Medication Sig Start Date End Date Taking? Authorizing Provider  Ascorbic Acid (VITAMIN C) 1000 MG  tablet Take 1,000 mg by mouth daily.   Yes Historical Provider, MD  Cholecalciferol (VITAMIN D) 2000 UNITS CAPS Take 2,000 Units by mouth daily.   Yes Historical Provider, MD  donepezil (ARICEPT) 10 MG tablet Take 1 tablet (10 mg total) by mouth at bedtime. 01/08/14  Yes Levert FeinsteinYijun Yan, MD  memantine (NAMENDA) 10 MG tablet Take 5 mg by mouth daily.  08/04/14  Yes Historical Provider, MD  naproxen sodium (ANAPROX) 220 MG tablet Take 440 mg by mouth 2 (two) times daily as needed (pain).   Yes Historical Provider, MD  Ergocalciferol (VITAMIN D2) 2000 UNITS TABS Take by mouth every other day.     Historical Provider, MD  Melatonin 3 MG TABS Take 3 mg by mouth daily as needed.    Historical Provider, MD  memantine [facility](NAMENDA TITRATION PAK) tablet pack 5 mg/day for =1 week; 5 mg twice daily for =1 week; 15 mg/day given in 5 mg and 10 mg separated doses for =1 week; then 10 mg twice daily 07/11/14   Butch PennyMegan Millikan, NP   BP 119/73 mmHg  Pulse 61  Temp(Src) 98.5 F (36.9 C) (Oral)  Resp 20  SpO2 98% Physical Exam  Constitutional: She is oriented to person, place, and time. She appears well-developed and well-nourished. No distress.  HENT:  Head: Normocephalic and atraumatic.  Nose: Nose normal.  Mouth/Throat: Oropharynx is clear and moist. No oropharyngeal exudate.  Tongue is swollen. No uvular swelling or deviation.  Eyes: Conjunctivae and EOM are normal. Pupils are equal, round, and reactive to light. No scleral icterus.  Neck: Normal range of motion. Neck supple. No JVD present. No tracheal deviation present. No thyromegaly present.  Cardiovascular: Normal rate, regular rhythm and normal heart sounds.  Exam reveals no gallop and no friction rub.   No murmur heard. Pulmonary/Chest: Effort normal and breath sounds normal. No respiratory distress. She has no wheezes. She exhibits no tenderness.  Abdominal: Soft. Bowel sounds are normal. She exhibits no distension and no mass. There is no tenderness. There  is no rebound and no guarding.  Musculoskeletal: Normal range of motion. She exhibits no edema or tenderness.  Lymphadenopathy:    She has no cervical adenopathy.  Neurological: She is alert and oriented to person, place, and time. No cranial nerve deficit. She exhibits normal muscle tone.  Skin: Skin is warm and dry. No rash noted. No erythema. No pallor.  Nursing note and vitals reviewed.   ED Course  Procedures (including critical care time) Labs Review Labs Reviewed - No data to display  Imaging Review No results found.   EKG Interpretation None      MDM   Final diagnoses:  None    Patient since emergency department for a swollen tongue. This is an allergic reaction versus angioedema. She was treated aggressively with epinephrine, steroids, H1 H2 blockers. We'll reassess.  Upon my repeat assessment of the patient her tongue swelling has significantly decreased. It appears to be back to normal to me, she states that it feels normal as well. She still has some mild tingling of the tongue. She'll be sent home with a prescription for an EpiPen, and prednisone for the next 4 days. She is advised to stay away from the serial she had tonight, follow with her primary care physician for continued treatment. Her vital signs are made within her normal limits and she is safe for discharge.   CRITICAL CARE Performed by: Tomasita CrumbleNI,Tarell Schollmeyer   Total critical care time: 40 min  Critical care time was exclusive of separately billable procedures and treating other patients.  Critical care was necessary to treat or prevent imminent or life-threatening deterioration.  Critical care was time spent personally by me on the following activities: development of treatment plan with patient and/or surrogate as well as nursing, discussions with consultants, evaluation of patient's response to treatment, examination of patient, obtaining history from patient or surrogate, ordering and performing treatments  and interventions, ordering and review of laboratory studies, ordering and review of radiographic studies, pulse oximetry and re-evaluation of patient's condition.   Tomasita CrumbleAdeleke Genaro Bekker, MD 08/31/14 [id number]

## 2014-08-31 NOTE — Discharge Instructions (Signed)
Angioedema Michaela Thompson, you were seen for swelling of her tongue. Continue to take steroids for the next 4 days. Use epinephrine pen if needed an emergency and then come to the emergency department. Follow-up with your primary care physician within 3 days for continued evaluation. If any symptoms worsen come back to the ER. Thank you. Angioedema is sudden puffiness (swelling), often of the skin. It can happen:  On your face or privates (genitals).  In your belly (abdomen) or other body parts. It usually happens quickly and gets better in 1 or 2 days. It often starts at night and is found when you wake up. You may get red, itchy patches of skin (hives). Attacks can be dangerous if your breathing passages get puffy. The condition may happen only once, or it can come back at random times. It may happen for several years before it goes away for good. HOME CARE  Only take medicines as told by your doctor.  Always carry your emergency allergy medicines with you.  Wear a medical bracelet as told by your doctor.  Avoid things that you know will cause attacks (triggers). GET HELP IF:  You have another attack.  Your attacks happen more often or get worse.  The condition was passed to you by your parents and you want to have children. GET HELP RIGHT AWAY IF:   Your mouth, tongue, or lips are very puffy.  You have trouble breathing.  You have trouble swallowing.  You pass out (faint). MAKE SURE YOU:   Understand these instructions.  Will watch your condition.  Will get help right away if you are not doing well or get worse. Document Released: 09/29/2009 Document Revised: 08/01/2013 Document Reviewed: 06/04/2013 [facility]ExitCare Patient Information 2015 [place] [place]. This information is not intended to replace advice given to you by your health care provider. Make sure you discuss any questions you have with your health care provider.

## 2014-11-11 ENCOUNTER — Ambulatory Visit: Payer: BC Managed Care – PPO | Admitting: Adult Health

## 2014-12-30 ENCOUNTER — Telehealth: Payer: Self-pay | Admitting: Adult Health

## 2014-12-30 NOTE — Telephone Encounter (Signed)
I have not seen this patient since September 2015. She was suppose to follow-up in 4 months. I will be happy to provide a letter but I need to see the patient in the office first.

## 2014-12-30 NOTE — Telephone Encounter (Signed)
Called patient and spoke to her husband. Patient will be here 12-31-14 to see Megan.

## 2014-12-30 NOTE — Telephone Encounter (Signed)
Called patient's husband back for more information. Patient husband want's to know if Aundra MilletMegan can write letter stating patient should not be driving with her memory loss. I stated to husband that Aundra MilletMegan was out of the office and I will talk to her on 12-31-14 . Stated to patient's husband there would be a $25.00 dollar fee if Aundra MilletMegan agreed to letter. Patient's husband was fine for Aundra MilletMegan to return .

## 2014-12-30 NOTE — Telephone Encounter (Signed)
Patient's husband is calling and needs a DMV form filled out stating it is not a good idea for her to drive as she has Dementia.  Please call when ready.

## 2014-12-31 ENCOUNTER — Telehealth: Payer: Self-pay

## 2014-12-31 ENCOUNTER — Encounter: Payer: Self-pay | Admitting: Adult Health

## 2014-12-31 ENCOUNTER — Ambulatory Visit (INDEPENDENT_AMBULATORY_CARE_PROVIDER_SITE_OTHER): Payer: BLUE CROSS/BLUE SHIELD | Admitting: Adult Health

## 2014-12-31 VITALS — BP 90/63 | HR 67 | Ht 63.0 in | Wt 127.0 lb

## 2014-12-31 DIAGNOSIS — G3109 Other frontotemporal dementia: Secondary | ICD-10-CM

## 2014-12-31 DIAGNOSIS — F0280 Dementia in other diseases classified elsewhere without behavioral disturbance: Secondary | ICD-10-CM | POA: Diagnosis not present

## 2014-12-31 NOTE — Telephone Encounter (Signed)
Called and spoke to patient's husband he will pickup letter.
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
## 2014-12-31 NOTE — Patient Instructions (Signed)
Memory is stable. I will write a [facility] for the [facility].  Driving should be limited.  Keep follow-up with [facility]

## 2014-12-31 NOTE — Progress Notes (Signed)
I have reviewed and agreed above plan. 

## 2015-01-01 DIAGNOSIS — Z0289 Encounter for other administrative examinations: Secondary | ICD-10-CM

## 2015-05-12 DIAGNOSIS — Z0289 Encounter for other administrative examinations: Secondary | ICD-10-CM

## 2015-06-02 DIAGNOSIS — Z0289 Encounter for other administrative examinations: Secondary | ICD-10-CM

## 2015-07-07 ENCOUNTER — Ambulatory Visit (INDEPENDENT_AMBULATORY_CARE_PROVIDER_SITE_OTHER): Payer: BLUE CROSS/BLUE SHIELD | Admitting: Adult Health

## 2015-07-07 ENCOUNTER — Encounter: Payer: Self-pay | Admitting: Adult Health

## 2015-07-07 VITALS — BP 106/71 | HR 64 | Ht 63.0 in | Wt 116.0 lb

## 2015-07-07 DIAGNOSIS — R413 Other amnesia: Secondary | ICD-10-CM

## 2015-07-07 MED ORDER — MEMANTINE HCL 28 X 5 MG & 21 X 10 MG PO TABS: ORAL_TABLET | ORAL | Status: DC

## 2015-07-07 NOTE — Patient Instructions (Signed)
Begin namenda titration pack. If unable to tolerate let us know. Call when you are almost done with the pack and we will order maintenance medication.  If your symptoms worsen or you develop new symptoms please let us know.   Memantine Tablets What is this medicine? MEMANTINE (MEM an teen) is used to treat dementia caused by Alzheimer's disease. This medicine may be used for other purposes; ask your health care provider or pharmacist if you have questions. COMMON BRAND NAME(S): Namenda What should I tell my health care provider before I take this medicine? They need to know if you have any of these conditions: -difficulty passing urine -kidney disease -liver disease -seizures -an unusual or allergic reaction to memantine, other medicines, foods, dyes, or preservatives -pregnant or trying to get pregnant -breast-feeding How should I use this medicine? Take this medicine by mouth with a glass of water. Follow the directions on the prescription label. You may take this medicine with or without food. Take your doses at regular intervals. Do not take your medicine more often than directed. Continue to take your medicine even if you feel better. Do not stop taking except on the advice of your doctor or health care professional. Talk to your pediatrician regarding the use of this medicine in children. Special care may be needed. Overdosage: If you think you have taken too much of this medicine contact a poison control center or emergency room at once. NOTE: This medicine is only for you. Do not share this medicine with others. What if I miss a dose? If you miss a dose, take it as soon as you can. If it is almost time for your next dose, take only that dose. Do not take double or extra doses. If you do not take your medicine for several days, contact your health care provider. Your dose may need to be changed. What may interact with this  medicine? -acetazolamide -amantadine -cimetidine -dextromethorphan -dofetilide -hydrochlorothiazide -ketamine -metformin -methazolamide -quinidine -ranitidine -sodium bicarbonate -triamterene This list may not describe all possible interactions. Give your health care provider a list of all the medicines, herbs, non-prescription drugs, or dietary supplements you use. Also tell them if you smoke, drink alcohol, or use illegal drugs. Some items may interact with your medicine. What should I watch for while using this medicine? Visit your doctor or health care professional for regular checks on your progress. Check with your doctor or health care professional if there is no improvement in your symptoms or if they get worse. You may get drowsy or dizzy. Do not drive, use machinery, or do anything that needs mental alertness until you know how this drug affects you. Do not stand or sit up quickly, especially if you are an older patient. This reduces the risk of dizzy or fainting spells. Alcohol can make you more drowsy and dizzy. Avoid alcoholic drinks. What side effects may I notice from receiving this medicine? Side effects that you should report to your doctor or health care professional as soon as possible: -allergic reactions like skin rash, itching or hives, swelling of the face, lips, or tongue -agitation or a feeling of restlessness -depressed mood -dizziness -hallucinations -redness, blistering, peeling or loosening of the skin, including inside the mouth -seizures -vomiting Side effects that usually do not require medical attention (report to your doctor or health care professional if they continue or are bothersome): -constipation -diarrhea -headache -nausea -trouble sleeping This list may not describe all possible side effects. Call your doctor for medical  advice about side effects. You may report side effects to FDA at 1-800-FDA-1088. Where should I keep my medicine? Keep  out of the reach of children. Store at room temperature between 15 degrees and 30 degrees C (59 degrees and 86 degrees F). Throw away any unused medicine after the expiration date. NOTE: This sheet is a summary. It may not cover all possible information. If you have questions about this medicine, talk to your doctor, pharmacist, or health care provider.  2015, [employer]vier/[employer] Standard. (2013-07-30 14:10:42)
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
## 2015-07-07 NOTE — Progress Notes (Signed)
I have reviewed and agreed above plan. 

## 2016-01-05 ENCOUNTER — Ambulatory Visit (INDEPENDENT_AMBULATORY_CARE_PROVIDER_SITE_OTHER): Payer: BLUE CROSS/BLUE SHIELD | Admitting: Neurology

## 2016-01-05 ENCOUNTER — Encounter: Payer: Self-pay | Admitting: Neurology

## 2016-01-05 VITALS — BP 113/73 | HR 64 | Ht 63.0 in | Wt 113.8 lb

## 2016-01-05 DIAGNOSIS — F0280 Dementia in other diseases classified elsewhere without behavioral disturbance: Secondary | ICD-10-CM

## 2016-01-05 DIAGNOSIS — G3109 Other frontotemporal dementia: Secondary | ICD-10-CM
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
## 2016-10-05 ENCOUNTER — Telehealth: Payer: Self-pay | Admitting: Neurology

## 2016-10-05 NOTE — Telephone Encounter (Signed)
Returned call to pt's husband - states her memory is worse (unable to recall names of children/grandchildren), she is more forgetful and she is exhibiting signs of OCD (especially with bathroom routine).  He feels she needs an early follow up.  She had a follow up w/ Eber Jonesarolyn in March 2018 but has been moved to 10/12/16.

## 2016-10-05 NOTE — Telephone Encounter (Signed)
Patient's husband is calling stating he thinks the patient needs an appointment before March when she has one with Eber Jonesarolyn. He states she has been obsessed with a lot of things x 6 months but she keeps getting worse. He is very concerned.  Please call and discuss.

## 2016-10-12 ENCOUNTER — Encounter: Payer: Self-pay | Admitting: Nurse Practitioner

## 2016-10-12 ENCOUNTER — Ambulatory Visit (INDEPENDENT_AMBULATORY_CARE_PROVIDER_SITE_OTHER): Payer: BLUE CROSS/BLUE SHIELD | Admitting: Nurse Practitioner

## 2016-10-12 VITALS — BP 121/76 | HR 69 | Ht 63.0 in | Wt 109.4 lb

## 2016-10-12 DIAGNOSIS — F0280 Dementia in other diseases classified elsewhere without behavioral disturbance: Secondary | ICD-10-CM

## 2016-10-12 DIAGNOSIS — G3109 Other frontotemporal dementia: Secondary | ICD-10-CM
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
## 2016-10-12 NOTE — Patient Instructions (Signed)
MMSE 11/30 today Continue to exercise daily F/U yearly and prn

## 2016-10-12 NOTE — Progress Notes (Signed)
I have reviewed and agreed above plan. 

## 2017-01-03 ENCOUNTER — Ambulatory Visit: Payer: BLUE CROSS/BLUE SHIELD | Admitting: Nurse Practitioner

## 2017-07-14 ENCOUNTER — Encounter: Payer: Self-pay | Admitting: Family Medicine
